# Patient Record
Sex: Male | Born: 1980 | Race: White | Hispanic: No | Smoking: Current every day smoker
Health system: Southern US, Community
[De-identification: ages and names within clinical notes are randomized; demographics above are authoritative.]

## PROBLEM LIST (undated history)

## (undated) DIAGNOSIS — N028 Recurrent and persistent hematuria with other morphologic changes: Secondary | ICD-10-CM

## (undated) DIAGNOSIS — N02B9 Other recurrent and persistent immunoglobulin A nephropathy: Secondary | ICD-10-CM

## (undated) HISTORY — PX: FINGER SURGERY: SHX640

## (undated) HISTORY — PX: KNEE SURGERY: SHX244

---

## 2004-01-06 ENCOUNTER — Emergency Department (HOSPITAL_COMMUNITY): Admission: EM | Admit: 2004-01-06 | Discharge: 2004-01-07 | Payer: Self-pay | Admitting: Emergency Medicine

## 2009-02-04 ENCOUNTER — Ambulatory Visit (HOSPITAL_COMMUNITY): Admission: RE | Admit: 2009-02-04 | Discharge: 2009-02-04 | Payer: Self-pay | Admitting: Internal Medicine

## 2009-02-06 ENCOUNTER — Ambulatory Visit (HOSPITAL_COMMUNITY): Admission: RE | Admit: 2009-02-06 | Discharge: 2009-02-06 | Payer: Self-pay | Admitting: Internal Medicine

## 2009-02-22 ENCOUNTER — Ambulatory Visit (HOSPITAL_BASED_OUTPATIENT_CLINIC_OR_DEPARTMENT_OTHER): Admission: RE | Admit: 2009-02-22 | Discharge: 2009-02-22 | Payer: Self-pay | Admitting: Orthopedic Surgery

## 2009-07-25 ENCOUNTER — Emergency Department (HOSPITAL_COMMUNITY): Admission: EM | Admit: 2009-07-25 | Discharge: 2009-07-25 | Payer: Self-pay | Admitting: Emergency Medicine

## 2009-09-28 ENCOUNTER — Emergency Department (HOSPITAL_COMMUNITY): Admission: EM | Admit: 2009-09-28 | Discharge: 2009-09-28 | Payer: Self-pay | Admitting: Emergency Medicine

## 2009-11-09 ENCOUNTER — Emergency Department (HOSPITAL_COMMUNITY): Admission: EM | Admit: 2009-11-09 | Discharge: 2009-11-09 | Payer: Self-pay | Admitting: Emergency Medicine

## 2009-12-23 ENCOUNTER — Emergency Department (HOSPITAL_COMMUNITY): Admission: EM | Admit: 2009-12-23 | Discharge: 2009-12-23 | Payer: Self-pay | Admitting: Emergency Medicine

## 2010-01-03 ENCOUNTER — Emergency Department (HOSPITAL_COMMUNITY): Admission: EM | Admit: 2010-01-03 | Discharge: 2010-01-03 | Payer: Self-pay | Admitting: Emergency Medicine

## 2010-03-31 ENCOUNTER — Emergency Department (HOSPITAL_COMMUNITY): Admission: EM | Admit: 2010-03-31 | Discharge: 2010-03-31 | Payer: Self-pay | Admitting: Emergency Medicine

## 2010-05-08 ENCOUNTER — Emergency Department (HOSPITAL_COMMUNITY): Admission: EM | Admit: 2010-05-08 | Discharge: 2010-05-08 | Payer: Self-pay | Admitting: Emergency Medicine

## 2010-06-03 ENCOUNTER — Emergency Department (HOSPITAL_COMMUNITY)
Admission: EM | Admit: 2010-06-03 | Discharge: 2010-06-03 | Payer: Self-pay | Source: Home / Self Care | Admitting: Emergency Medicine

## 2010-09-07 ENCOUNTER — Emergency Department (HOSPITAL_COMMUNITY)
Admission: EM | Admit: 2010-09-07 | Discharge: 2010-09-07 | Payer: Self-pay | Source: Home / Self Care | Admitting: Emergency Medicine

## 2011-01-01 IMAGING — CR DG FOOT COMPLETE 3+V*R*
3 series · 3 of 3 positions shown · non-contrast
Comparison: None.

CLINICAL DATA: Laceration

RIGHT FOOT COMPLETE - 3+ VIEW

[view not recorded (1 of 3)]
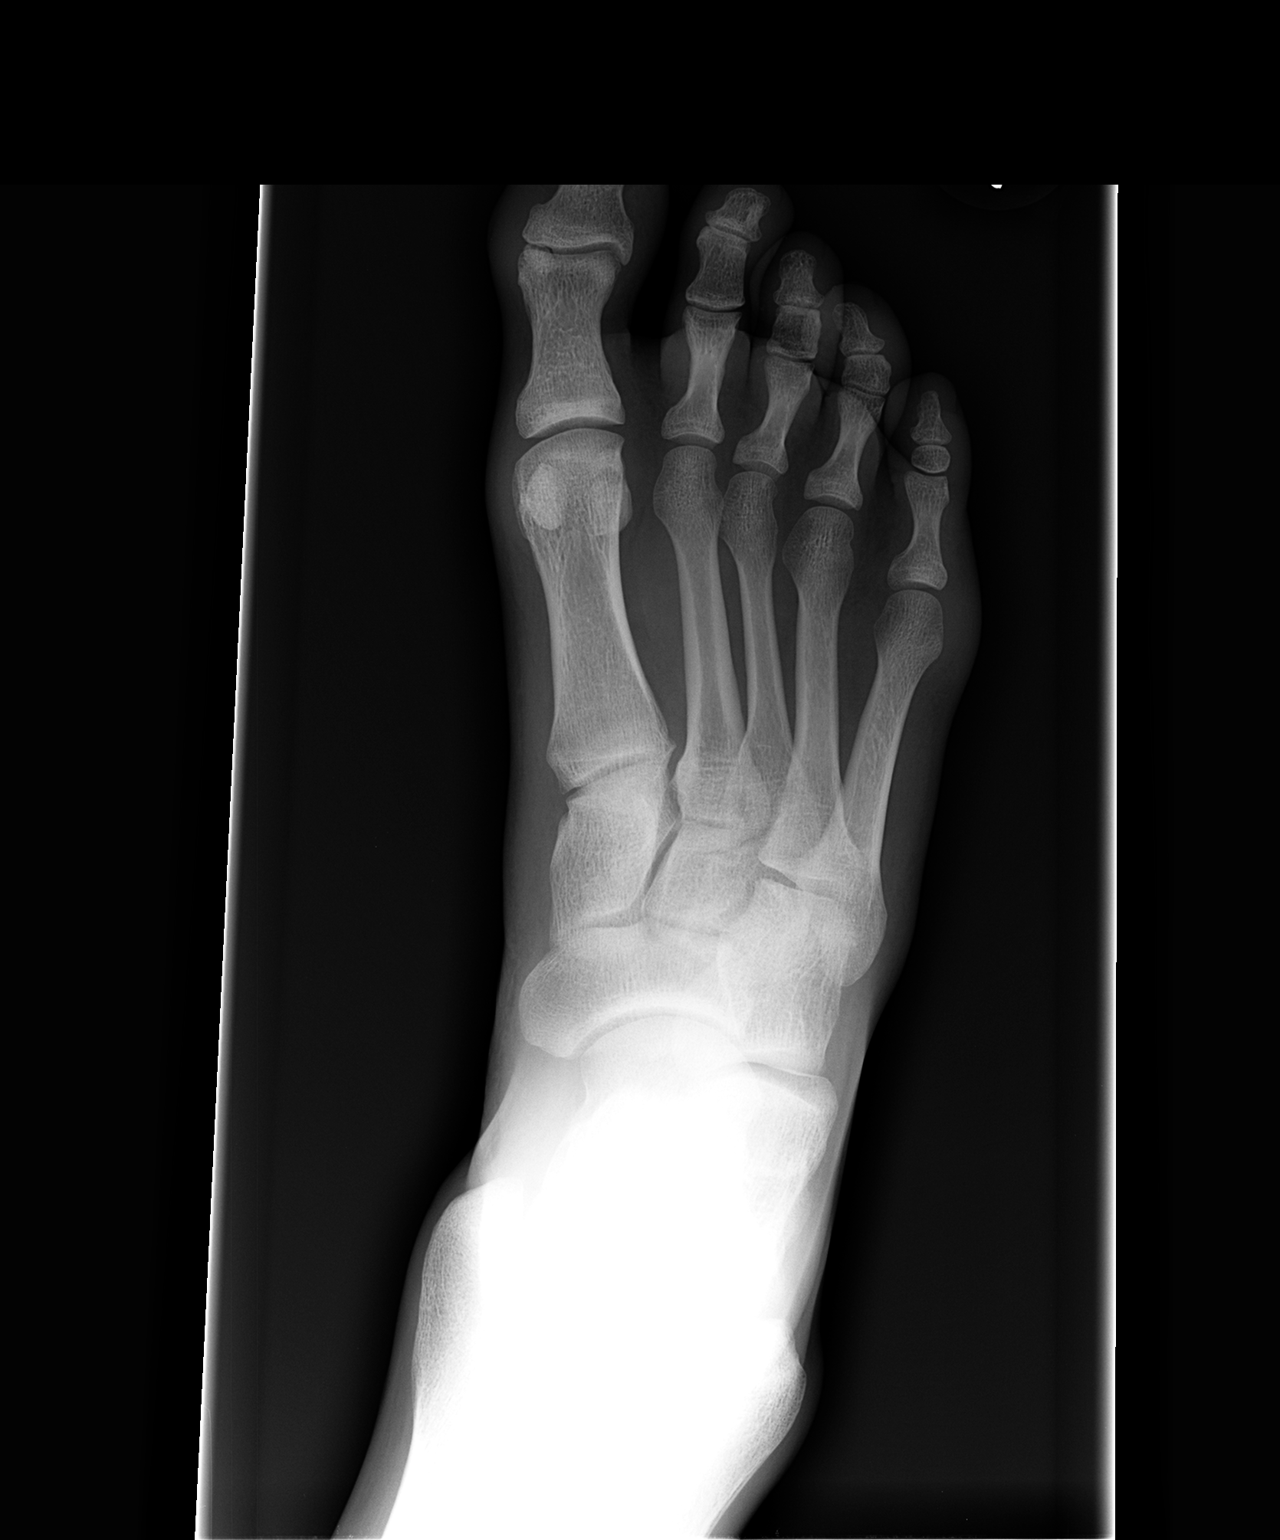

[view not recorded (2 of 3)]
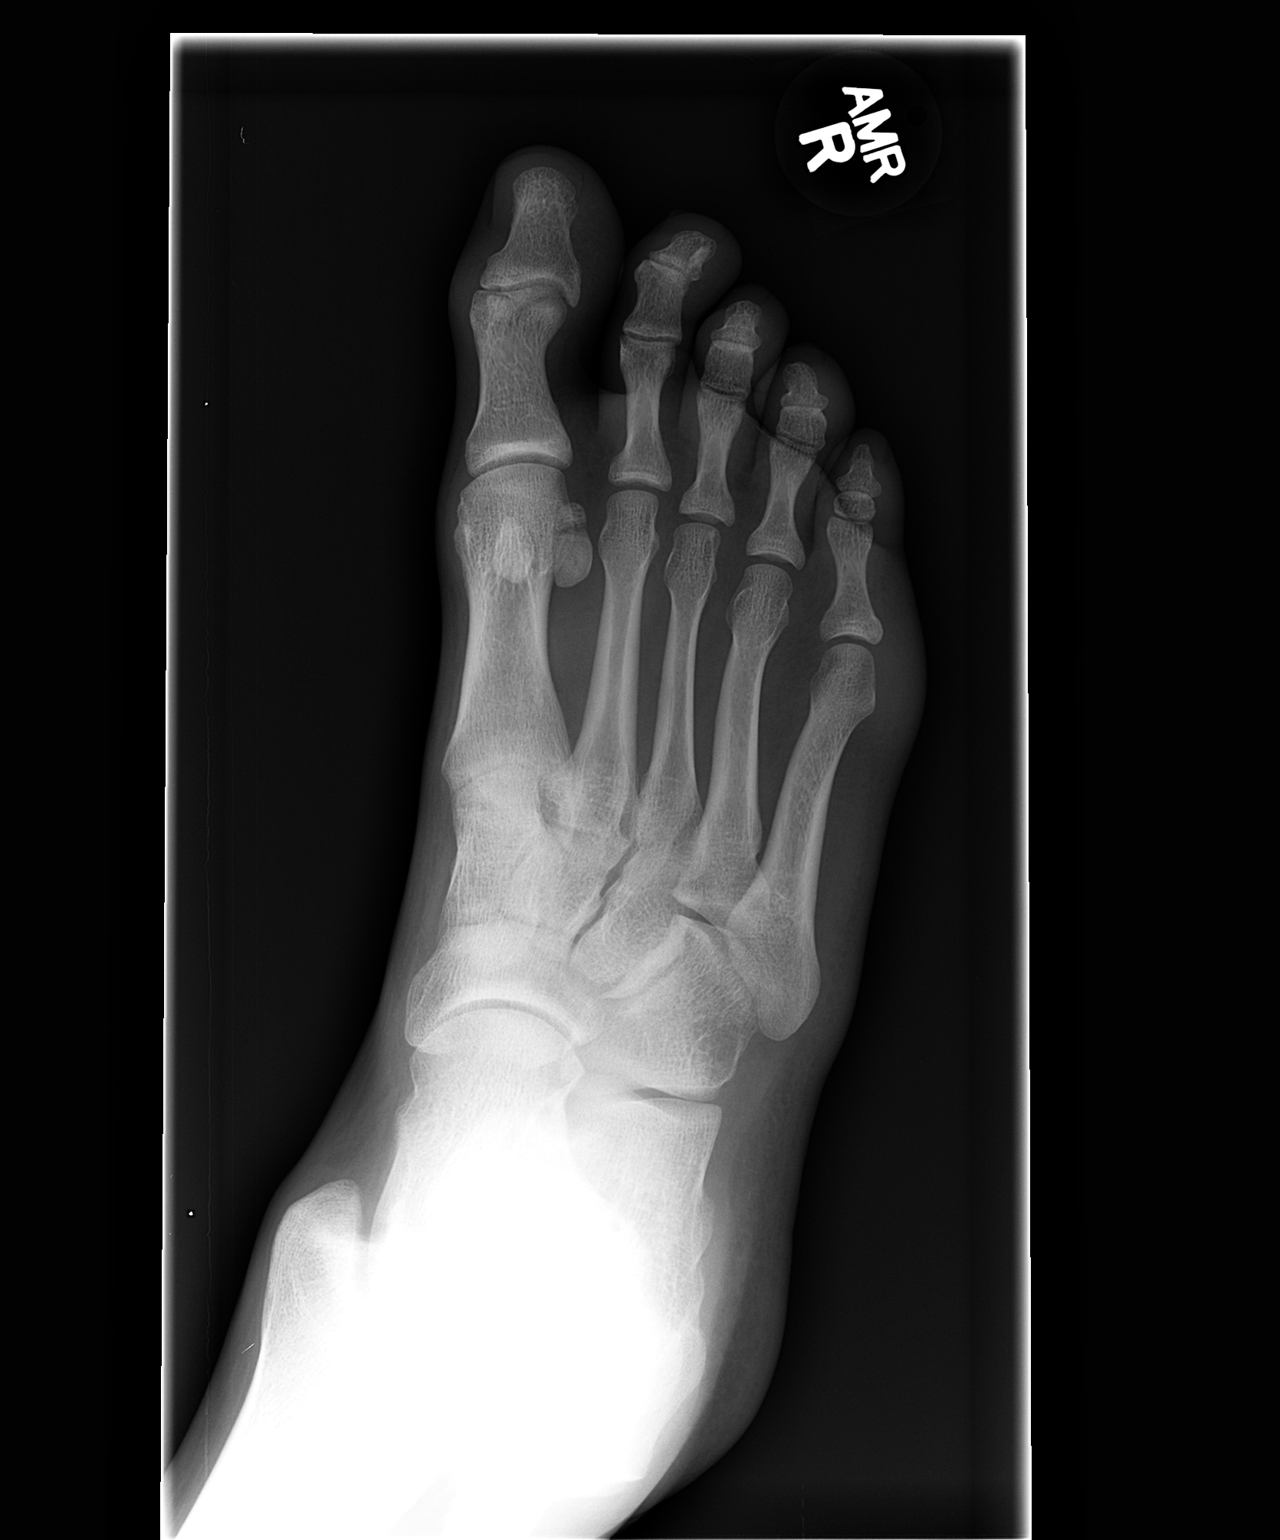

[view not recorded (3 of 3)]
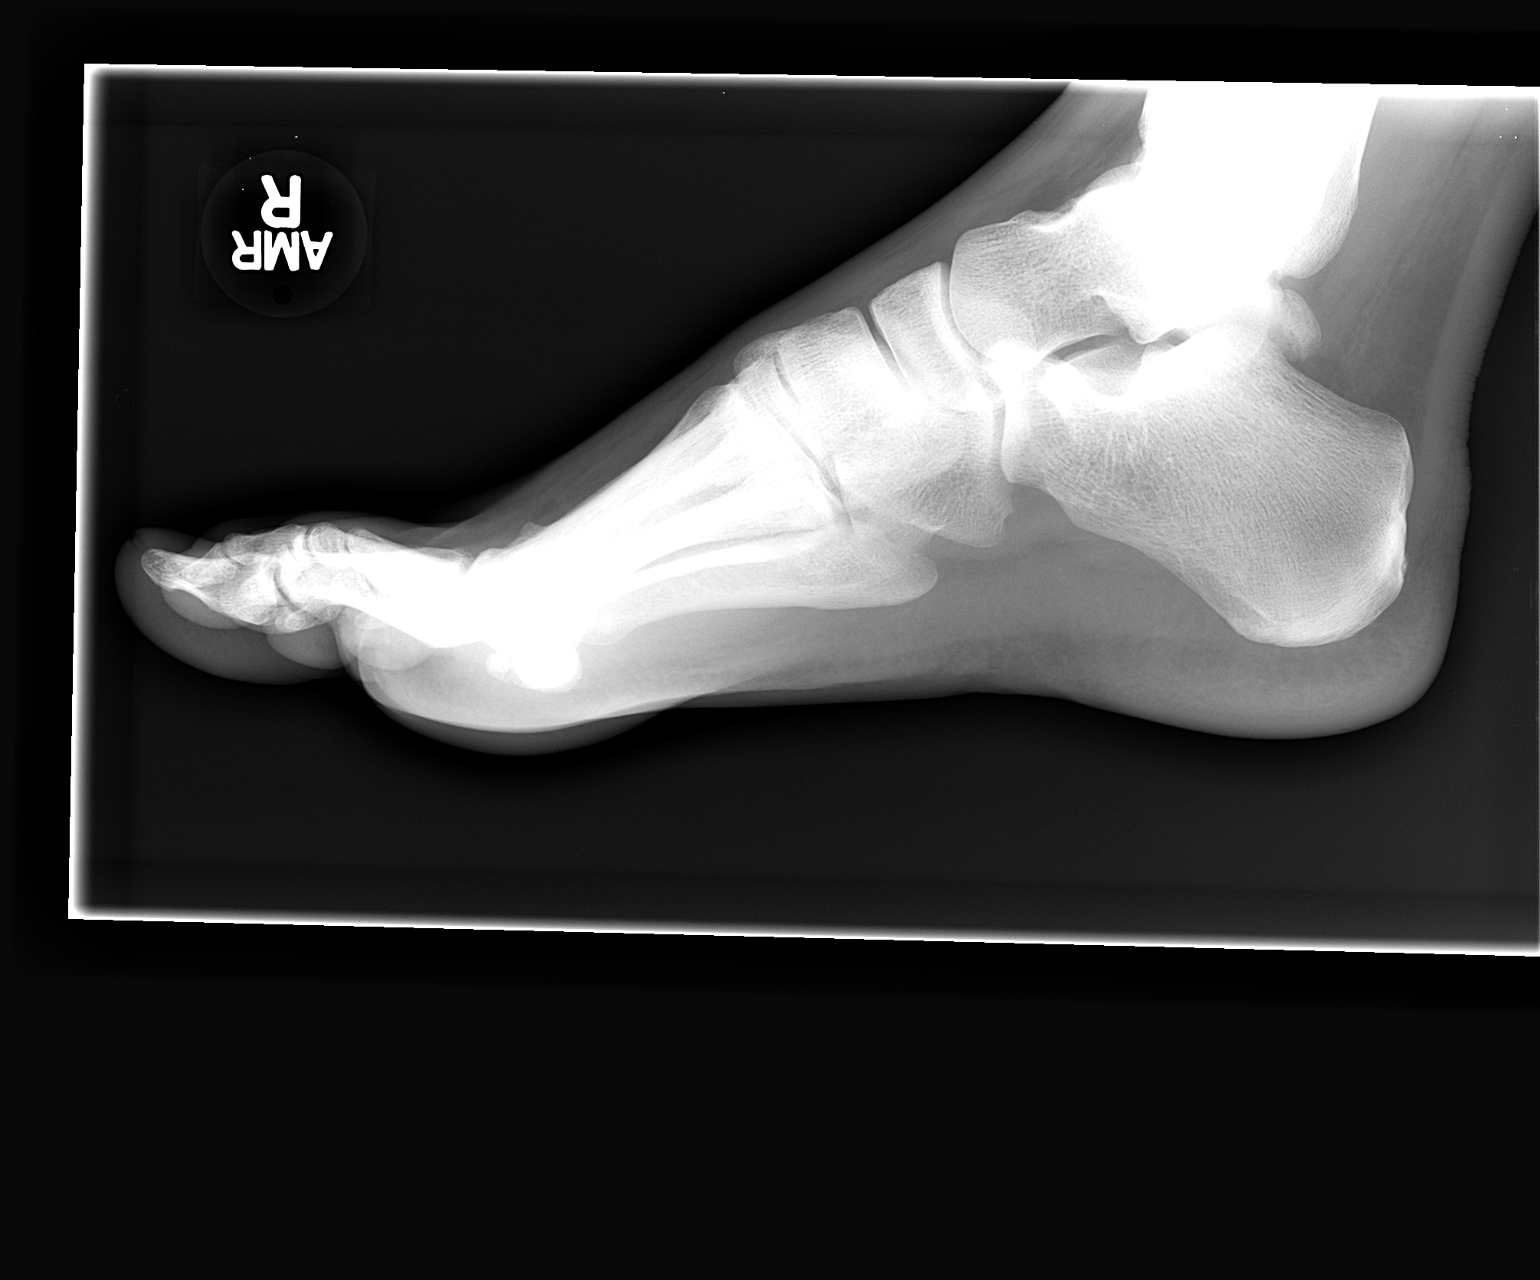

[3 of 3 positions shown; findings below may reference images not displayed]

FINDINGS: No radiodense foreign body or subcutaneous gas. Negative
for fracture, dislocation, or other acute abnormality.  Normal
alignment and mineralization. No significant degenerative change.
Regional soft tissues unremarkable.
IMPRESSION: Negative

## 2011-01-04 ENCOUNTER — Emergency Department (HOSPITAL_COMMUNITY)
Admission: EM | Admit: 2011-01-04 | Discharge: 2011-01-04 | Disposition: A | Discharge status: Home / Self Care | Payer: Medicaid Other | Attending: Emergency Medicine | Admitting: Emergency Medicine

## 2011-01-04 ENCOUNTER — Emergency Department (HOSPITAL_COMMUNITY): Payer: Medicaid Other

## 2011-01-04 DIAGNOSIS — M545 Low back pain, unspecified: Secondary | ICD-10-CM | POA: Insufficient documentation

## 2011-01-04 DIAGNOSIS — M25529 Pain in unspecified elbow: Secondary | ICD-10-CM | POA: Insufficient documentation

## 2011-01-04 DIAGNOSIS — M546 Pain in thoracic spine: Secondary | ICD-10-CM | POA: Insufficient documentation

## 2011-01-04 DIAGNOSIS — S20229A Contusion of unspecified back wall of thorax, initial encounter: Secondary | ICD-10-CM | POA: Insufficient documentation

## 2011-01-04 DIAGNOSIS — S0003XA Contusion of scalp, initial encounter: Secondary | ICD-10-CM | POA: Insufficient documentation

## 2011-01-04 DIAGNOSIS — F319 Bipolar disorder, unspecified: Secondary | ICD-10-CM | POA: Insufficient documentation

## 2011-01-04 DIAGNOSIS — W1789XA Other fall from one level to another, initial encounter: Secondary | ICD-10-CM | POA: Insufficient documentation

## 2011-01-04 DIAGNOSIS — G40909 Epilepsy, unspecified, not intractable, without status epilepticus: Secondary | ICD-10-CM | POA: Insufficient documentation

## 2011-01-04 DIAGNOSIS — M542 Cervicalgia: Secondary | ICD-10-CM | POA: Insufficient documentation

## 2011-01-04 DIAGNOSIS — Y92009 Unspecified place in unspecified non-institutional (private) residence as the place of occurrence of the external cause: Secondary | ICD-10-CM | POA: Insufficient documentation

## 2011-01-04 DIAGNOSIS — S5000XA Contusion of unspecified elbow, initial encounter: Secondary | ICD-10-CM | POA: Insufficient documentation

## 2011-01-04 DIAGNOSIS — Z79899 Other long term (current) drug therapy: Secondary | ICD-10-CM | POA: Insufficient documentation

## 2011-01-06 LAB — POCT HEMOGLOBIN-HEMACUE: Hemoglobin: 14.9 g/dL (ref 13.0–17.0)

## 2011-01-08 ENCOUNTER — Emergency Department (HOSPITAL_COMMUNITY): Payer: Medicaid Other

## 2011-01-08 ENCOUNTER — Emergency Department (HOSPITAL_COMMUNITY)
Admission: EM | Admit: 2011-01-08 | Discharge: 2011-01-08 | Discharge status: Against Medical Advice | Payer: Medicaid Other | Attending: Emergency Medicine | Admitting: Emergency Medicine

## 2011-01-08 DIAGNOSIS — F17200 Nicotine dependence, unspecified, uncomplicated: Secondary | ICD-10-CM | POA: Insufficient documentation

## 2011-01-08 DIAGNOSIS — W1789XA Other fall from one level to another, initial encounter: Secondary | ICD-10-CM | POA: Insufficient documentation

## 2011-01-08 DIAGNOSIS — R079 Chest pain, unspecified: Secondary | ICD-10-CM | POA: Insufficient documentation

## 2011-01-08 DIAGNOSIS — S20219A Contusion of unspecified front wall of thorax, initial encounter: Secondary | ICD-10-CM | POA: Insufficient documentation

## 2011-02-08 ENCOUNTER — Emergency Department (HOSPITAL_COMMUNITY): Payer: Medicaid Other

## 2011-02-08 ENCOUNTER — Emergency Department (HOSPITAL_COMMUNITY)
Admission: EM | Admit: 2011-02-08 | Discharge: 2011-02-08 | Disposition: A | Discharge status: Home / Self Care | Payer: Medicaid Other | Attending: Emergency Medicine | Admitting: Emergency Medicine

## 2011-02-08 DIAGNOSIS — K029 Dental caries, unspecified: Secondary | ICD-10-CM | POA: Insufficient documentation

## 2011-02-08 DIAGNOSIS — F319 Bipolar disorder, unspecified: Secondary | ICD-10-CM | POA: Insufficient documentation

## 2011-02-08 DIAGNOSIS — IMO0002 Reserved for concepts with insufficient information to code with codable children: Secondary | ICD-10-CM | POA: Insufficient documentation

## 2011-02-08 DIAGNOSIS — F411 Generalized anxiety disorder: Secondary | ICD-10-CM | POA: Insufficient documentation

## 2011-02-08 DIAGNOSIS — Z9889 Other specified postprocedural states: Secondary | ICD-10-CM | POA: Insufficient documentation

## 2011-02-08 DIAGNOSIS — G40909 Epilepsy, unspecified, not intractable, without status epilepticus: Secondary | ICD-10-CM | POA: Insufficient documentation

## 2011-02-08 DIAGNOSIS — R51 Headache: Secondary | ICD-10-CM | POA: Insufficient documentation

## 2011-02-08 DIAGNOSIS — S0003XA Contusion of scalp, initial encounter: Secondary | ICD-10-CM | POA: Insufficient documentation

## 2011-02-08 DIAGNOSIS — S1093XA Contusion of unspecified part of neck, initial encounter: Secondary | ICD-10-CM | POA: Insufficient documentation

## 2011-02-08 DIAGNOSIS — Z79899 Other long term (current) drug therapy: Secondary | ICD-10-CM | POA: Insufficient documentation

## 2011-02-08 DIAGNOSIS — R6884 Jaw pain: Secondary | ICD-10-CM | POA: Insufficient documentation

## 2011-02-10 NOTE — Op Note (Signed)
NAMEELIAS, Caleb Campbell             ACCOUNT NO.:  [id number]   MEDICAL RECORD NO.:  [id number]          PATIENT TYPE:  AMB   LOCATION:  DSC                          FACILITY:  [facility]   PHYSICIAN:  Harvie Junior, M.D.   DATE OF BIRTH:  May 28, 1981   DATE OF PROCEDURE:  02/22/2009  DATE OF DISCHARGE:                               OPERATIVE REPORT   PREOPERATIVE DIAGNOSIS:  Lateral meniscal tear.   POSTOPERATIVE DIAGNOSES:  1. Lateral meniscal tear.  2. Medial femoral condylar defect.  3. Osteocartilaginous loose body.   PRINCIPAL PROCEDURE:  1. Partial posterior horn lateral meniscectomy.  2. Chondroplasty of the medial femoral condyle.  3. Removal of osteocartilaginous loose body.   SURGEON:  Harvie Junior, M.D.   ASSISTANT:  Marshia Ly, P.A.   ANESTHESIA:  General.   BRIEF HISTORY:  Ms. Dyar is a 30 year old male with a long history  of having had significant right knee pain.  We treated conservatively  for a period of time.  An MRI showed that he had lateral meniscal tear  and loose bodies.  He was brought to the operating room for operative  intervention.   PROCEDURE:  The patient was brought to the operating room.  After  adequate anesthesia obtained with general anesthetic, the patient was  placed supine on the operating table.  The right leg was then prepped  and draped in usual sterile fashion.  Following this routine  arthroscopic examination of knee revealed there was an obvious  osteocartilaginous loose body, which was removed with a grasper.  Attention was turned to the medial femoral condyle with the significant  chondromalacia of the medial femoral condyle.  Attention was turned up  in the patellofemoral joint, little bit of chondromalacia, not  significantly debrided by a patellar tracking his midline.  Attention  was turned to ACL, which was normal.  Attention was turned to the  lateral side, which has a lateral flap, which is flapping essentially in  a sort of a complex posterior horn tear.  This was debrided with a  suction shaver and straight biting forceps back to a smooth and stable  rim.  Once this was completed, the knee was copiously and  thoroughly irrigated, suctioned dry, and all sterile portals were closed  with a bandage.  A sterile compression dressing was applied.  The  patient was taken to the recovery room and was noted to be in a  satisfactory condition.  Estimated blood loss for the procedure is none.      Harvie Junior, M.D.  Electronically Signed     JLG/MEDQ  D:  02/22/2009  T:  02/23/2009  Job:  [id number]

## 2011-03-13 ENCOUNTER — Emergency Department (HOSPITAL_COMMUNITY)
Admission: EM | Admit: 2011-03-13 | Discharge: 2011-03-13 | Discharge status: Against Medical Advice | Payer: Medicaid Other | Attending: Emergency Medicine | Admitting: Emergency Medicine

## 2011-03-13 DIAGNOSIS — Z0389 Encounter for observation for other suspected diseases and conditions ruled out: Secondary | ICD-10-CM | POA: Insufficient documentation

## 2011-03-14 ENCOUNTER — Emergency Department (HOSPITAL_COMMUNITY): Payer: Medicaid Other

## 2011-03-14 ENCOUNTER — Emergency Department (HOSPITAL_COMMUNITY)
Admission: EM | Admit: 2011-03-14 | Discharge: 2011-03-14 | Disposition: A | Discharge status: Home / Self Care | Payer: Medicaid Other | Attending: Emergency Medicine | Admitting: Emergency Medicine

## 2011-03-14 DIAGNOSIS — F319 Bipolar disorder, unspecified: Secondary | ICD-10-CM | POA: Insufficient documentation

## 2011-03-14 DIAGNOSIS — X500XXA Overexertion from strenuous movement or load, initial encounter: Secondary | ICD-10-CM | POA: Insufficient documentation

## 2011-03-14 DIAGNOSIS — F17200 Nicotine dependence, unspecified, uncomplicated: Secondary | ICD-10-CM | POA: Insufficient documentation

## 2011-03-14 DIAGNOSIS — F411 Generalized anxiety disorder: Secondary | ICD-10-CM | POA: Insufficient documentation

## 2011-03-14 DIAGNOSIS — G40909 Epilepsy, unspecified, not intractable, without status epilepticus: Secondary | ICD-10-CM | POA: Insufficient documentation

## 2011-03-14 DIAGNOSIS — IMO0002 Reserved for concepts with insufficient information to code with codable children: Secondary | ICD-10-CM | POA: Insufficient documentation

## 2011-03-29 ENCOUNTER — Emergency Department (HOSPITAL_COMMUNITY)
Admission: EM | Admit: 2011-03-29 | Discharge: 2011-03-29 | Disposition: A | Discharge status: Home / Self Care | Payer: Medicaid Other | Attending: Emergency Medicine | Admitting: Emergency Medicine

## 2011-03-29 DIAGNOSIS — F1210 Cannabis abuse, uncomplicated: Secondary | ICD-10-CM | POA: Insufficient documentation

## 2011-03-29 DIAGNOSIS — S51809A Unspecified open wound of unspecified forearm, initial encounter: Secondary | ICD-10-CM | POA: Insufficient documentation

## 2011-03-29 DIAGNOSIS — F319 Bipolar disorder, unspecified: Secondary | ICD-10-CM | POA: Insufficient documentation

## 2011-03-29 DIAGNOSIS — Z79899 Other long term (current) drug therapy: Secondary | ICD-10-CM | POA: Insufficient documentation

## 2011-03-29 DIAGNOSIS — G40909 Epilepsy, unspecified, not intractable, without status epilepticus: Secondary | ICD-10-CM | POA: Insufficient documentation

## 2011-03-29 DIAGNOSIS — F411 Generalized anxiety disorder: Secondary | ICD-10-CM | POA: Insufficient documentation

## 2011-03-29 DIAGNOSIS — W268XXA Contact with other sharp object(s), not elsewhere classified, initial encounter: Secondary | ICD-10-CM | POA: Insufficient documentation

## 2011-12-15 IMAGING — CR DG LUMBAR SPINE COMPLETE 4+V
5 series · 5 of 5 positions shown · non-contrast
Comparison: 01/04/2011

CLINICAL DATA: Low back pain, prior fall

LUMBAR SPINE - COMPLETE 4+ VIEW

[view not recorded (1 of 5)]
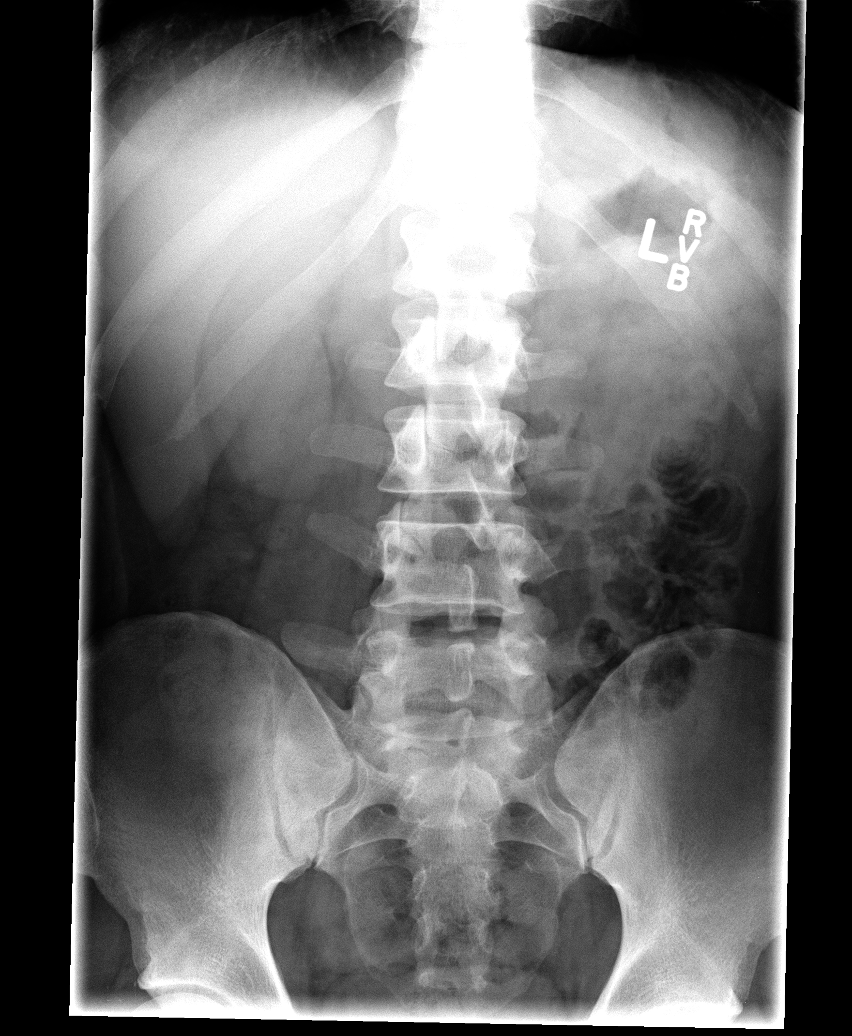

[view not recorded (2 of 5)]
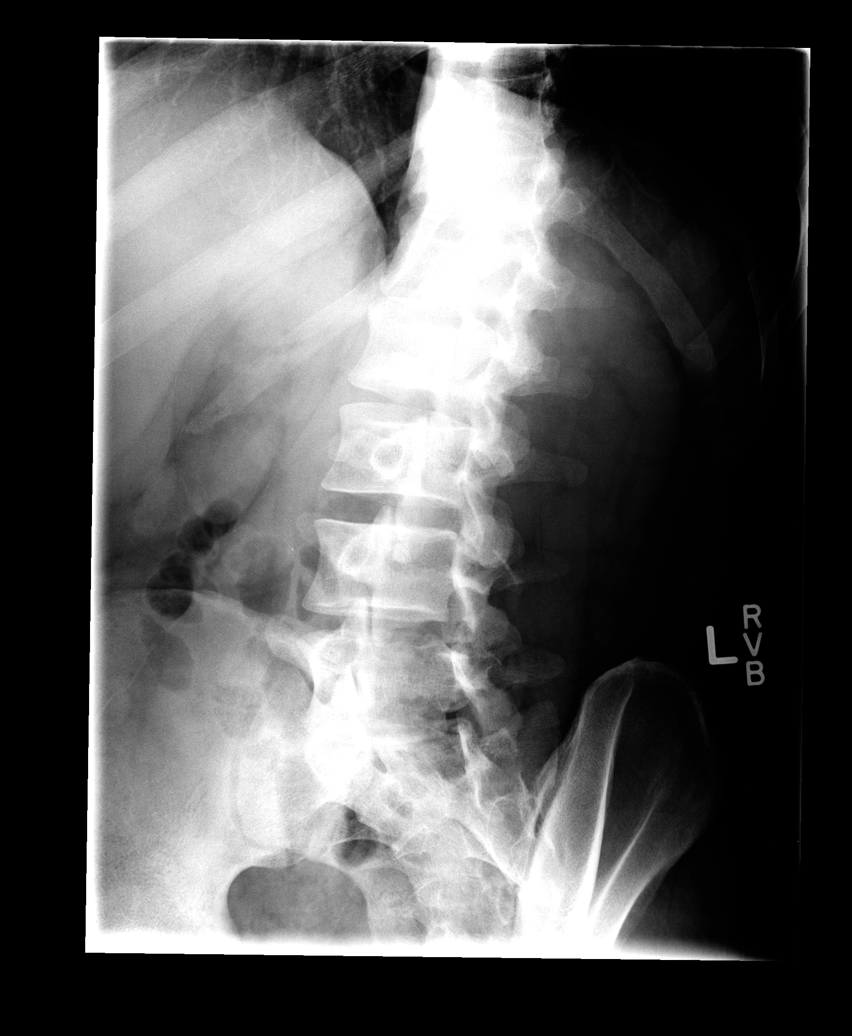

[view not recorded (3 of 5)]
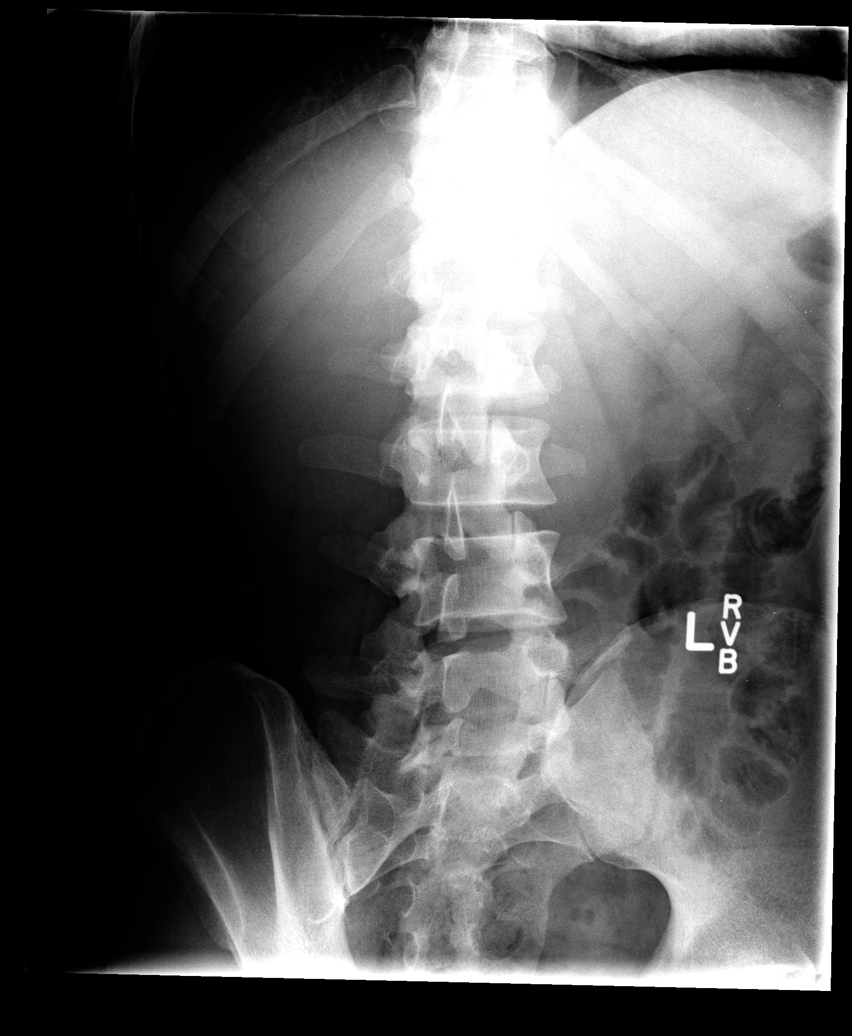

[view not recorded (4 of 5)]
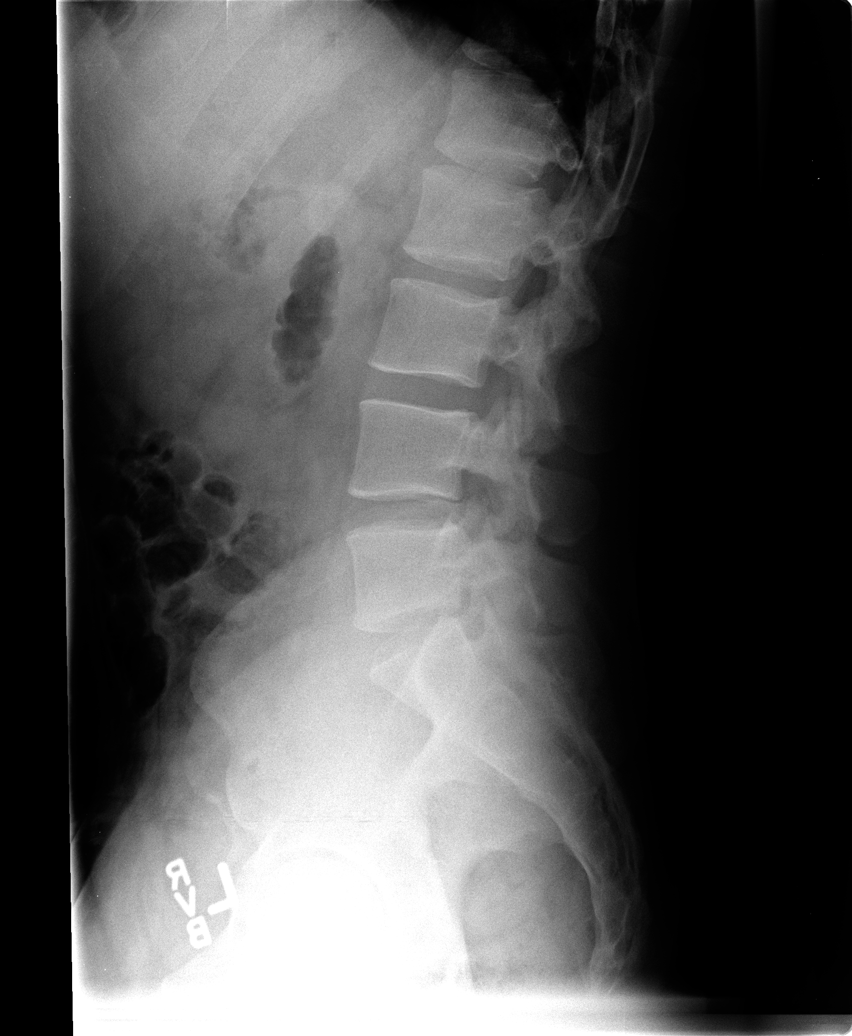

[view not recorded (5 of 5)]
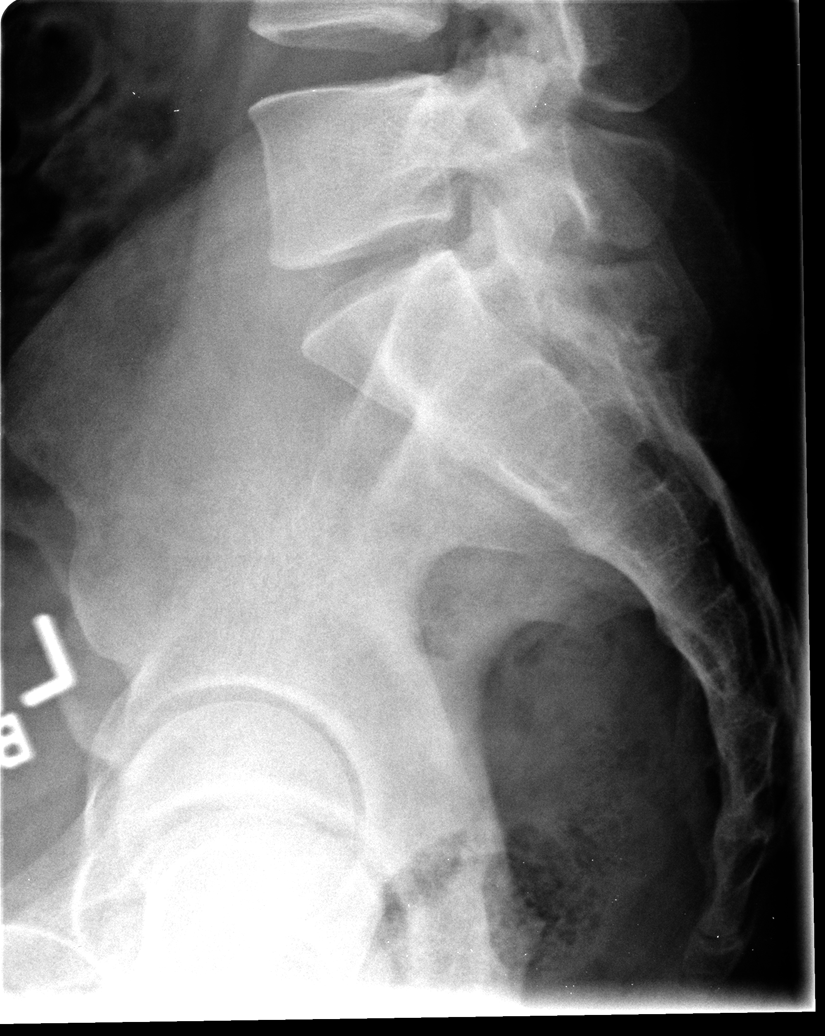

[5 of 5 positions shown; findings below may reference images not displayed]

FINDINGS: There are five lumbar-type vertebral bodies.

Lumbar spine is normal in alignment and position.

No evidence of fracture or dislocation.  The vertebral body heights
and intervertebral disc spaces are maintained.

The visualized bony pelvis appears intact.
IMPRESSION: No fracture or dislocation is seen.

## 2022-03-31 ENCOUNTER — Emergency Department (HOSPITAL_COMMUNITY): Payer: 59

## 2022-03-31 ENCOUNTER — Emergency Department (HOSPITAL_COMMUNITY)
Admission: EM | Admit: 2022-03-31 | Discharge: 2022-03-31 | Disposition: A | Discharge status: Home / Self Care | Payer: 59 | Attending: Emergency Medicine | Admitting: Emergency Medicine

## 2022-03-31 ENCOUNTER — Other Ambulatory Visit: Payer: Self-pay

## 2022-03-31 ENCOUNTER — Encounter (HOSPITAL_COMMUNITY): Payer: Self-pay

## 2022-03-31 DIAGNOSIS — W228XXA Striking against or struck by other objects, initial encounter: Secondary | ICD-10-CM | POA: Diagnosis not present

## 2022-03-31 DIAGNOSIS — S29001A Unspecified injury of muscle and tendon of front wall of thorax, initial encounter: Secondary | ICD-10-CM | POA: Diagnosis not present

## 2022-03-31 DIAGNOSIS — M25561 Pain in right knee: Secondary | ICD-10-CM

## 2022-03-31 DIAGNOSIS — R0781 Pleurodynia: Secondary | ICD-10-CM | POA: Diagnosis not present

## 2022-03-31 DIAGNOSIS — R1012 Left upper quadrant pain: Secondary | ICD-10-CM | POA: Diagnosis not present

## 2022-03-31 DIAGNOSIS — S20213A Contusion of bilateral front wall of thorax, initial encounter: Secondary | ICD-10-CM | POA: Diagnosis not present

## 2022-03-31 DIAGNOSIS — S20212A Contusion of left front wall of thorax, initial encounter: Secondary | ICD-10-CM

## 2022-03-31 DIAGNOSIS — S8001XA Contusion of right knee, initial encounter: Secondary | ICD-10-CM | POA: Diagnosis not present

## 2022-03-31 HISTORY — DX: Recurrent and persistent hematuria with other morphologic changes: N02.8

## 2022-03-31 HISTORY — DX: Other recurrent and persistent immunoglobulin A nephropathy: N02.B9

## 2022-03-31 LAB — CBC WITH DIFFERENTIAL/PLATELET
Abs Immature Granulocytes: 0.03 10*3/uL (ref 0.00–0.07)
Basophils Absolute: 0.1 10*3/uL (ref 0.0–0.1)
Basophils Relative: 1 %
Eosinophils Absolute: 0.1 10*3/uL (ref 0.0–0.5)
Eosinophils Relative: 1 %
HCT: 45.8 % (ref 39.0–52.0)
Hemoglobin: 15.7 g/dL (ref 13.0–17.0)
Immature Granulocytes: 0 %
Lymphocytes Relative: 27 %
Lymphs Abs: 2.5 10*3/uL (ref 0.7–4.0)
MCH: 32 pg (ref 26.0–34.0)
MCHC: 34.3 g/dL (ref 30.0–36.0)
MCV: 93.5 fL (ref 80.0–100.0)
Monocytes Absolute: 0.8 10*3/uL (ref 0.1–1.0)
Monocytes Relative: 9 %
Neutro Abs: 5.9 10*3/uL (ref 1.7–7.7)
Neutrophils Relative %: 62 %
Platelets: 311 10*3/uL (ref 150–400)
RBC: 4.9 MIL/uL (ref 4.22–5.81)
RDW: 12.8 % (ref 11.5–15.5)
WBC: 9.3 10*3/uL (ref 4.0–10.5)
nRBC: 0 % (ref 0.0–0.2)

## 2022-03-31 LAB — COMPREHENSIVE METABOLIC PANEL
ALT: 19 U/L (ref 0–44)
AST: 21 U/L (ref 15–41)
Albumin: 4.2 g/dL (ref 3.5–5.0)
Alkaline Phosphatase: 73 U/L (ref 38–126)
Anion gap: 7 (ref 5–15)
BUN: 15 mg/dL (ref 6–20)
CO2: 25 mmol/L (ref 22–32)
Calcium: 9 mg/dL (ref 8.9–10.3)
Chloride: 108 mmol/L (ref 98–111)
Creatinine, Ser: 0.96 mg/dL (ref 0.61–1.24)
GFR, Estimated: >60 mL/min (ref >60)
Glucose, Bld: 102 mg/dL — ABNORMAL HIGH (ref 70–99)
Potassium: 3.7 mmol/L (ref 3.5–5.1)
Sodium: 140 mmol/L (ref 135–145)
Total Bilirubin: 0.7 mg/dL (ref 0.3–1.2)
Total Protein: 7.6 g/dL (ref 6.5–8.1)

## 2022-03-31 MED ORDER — HYDROCODONE-ACETAMINOPHEN 5-325 MG PO TABS: 2.0000 | ORAL_TABLET | Freq: Four times a day (QID) | ORAL | 0 refills | Status: DC | PRN

## 2022-03-31 MED ORDER — HYDROCODONE-ACETAMINOPHEN 5-325 MG PO TABS
2.0000 | ORAL_TABLET | Freq: Once | ORAL | Status: AC
  Administered 2022-03-31: 2 via ORAL
  Filled 2022-03-31: qty 2

## 2022-03-31 MED ORDER — IOHEXOL 300 MG/ML  SOLN
100.0000 mL | Freq: Once | INTRAMUSCULAR | Status: AC | PRN
  Administered 2022-03-31: 100 mL via INTRAVENOUS
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
## 2022-03-31 NOTE — ED Notes (Signed)
Patient transported to CT 

## 2022-03-31 NOTE — Discharge Instructions (Signed)
Please pick up your medications at the pharmacy listed on this paperwork.  I have also given you number to call orthopedics.  Please return to the emergency department for any worsening symptoms you might have.

## 2022-07-01 DIAGNOSIS — R630 Anorexia: Secondary | ICD-10-CM | POA: Diagnosis not present

## 2022-07-01 DIAGNOSIS — R5383 Other fatigue: Secondary | ICD-10-CM | POA: Diagnosis not present

## 2022-08-12 DIAGNOSIS — Z79899 Other long term (current) drug therapy: Secondary | ICD-10-CM | POA: Diagnosis not present

## 2022-08-12 DIAGNOSIS — F1124 Opioid dependence with opioid-induced mood disorder: Secondary | ICD-10-CM | POA: Diagnosis not present

## 2022-08-16 ENCOUNTER — Emergency Department (HOSPITAL_COMMUNITY)
Admission: EM | Admit: 2022-08-16 | Discharge: 2022-08-16 | Discharge status: Against Medical Advice | Payer: 59 | Source: Home / Self Care

## 2022-08-17 DIAGNOSIS — Z79899 Other long term (current) drug therapy: Secondary | ICD-10-CM | POA: Diagnosis not present

## 2022-08-17 DIAGNOSIS — F1124 Opioid dependence with opioid-induced mood disorder: Secondary | ICD-10-CM | POA: Diagnosis not present

## 2022-08-24 DIAGNOSIS — Z79899 Other long term (current) drug therapy: Secondary | ICD-10-CM | POA: Diagnosis not present

## 2022-08-24 DIAGNOSIS — F1124 Opioid dependence with opioid-induced mood disorder: Secondary | ICD-10-CM | POA: Diagnosis not present

## 2022-08-26 ENCOUNTER — Ambulatory Visit (INDEPENDENT_AMBULATORY_CARE_PROVIDER_SITE_OTHER): Payer: 59 | Admitting: Nurse Practitioner

## 2022-08-26 ENCOUNTER — Encounter: Payer: Self-pay | Admitting: Nurse Practitioner

## 2022-08-26 VITALS — BP 133/82 | HR 87 | Temp 98.6°F | Ht 72.0 in | Wt 172.0 lb

## 2022-08-26 DIAGNOSIS — Z0000 Encounter for general adult medical examination without abnormal findings: Secondary | ICD-10-CM | POA: Diagnosis not present

## 2022-08-26 DIAGNOSIS — R0789 Other chest pain: Secondary | ICD-10-CM

## 2022-08-26 DIAGNOSIS — Z0001 Encounter for general adult medical examination with abnormal findings: Secondary | ICD-10-CM

## 2022-08-26 DIAGNOSIS — Z7689 Persons encountering health services in other specified circumstances: Secondary | ICD-10-CM | POA: Insufficient documentation

## 2022-08-26 MED ORDER — METHOCARBAMOL 500 MG PO TABS: 500.0000 mg | ORAL_TABLET | Freq: Four times a day (QID) | ORAL | 1 refills | Status: DC

## 2022-08-26 MED ORDER — PREDNISONE 20 MG PO TABS: 20.0000 mg | ORAL_TABLET | Freq: Every day | ORAL | 0 refills | Status: DC

## 2022-08-26 NOTE — Patient Instructions (Signed)
Chest Wall Pain Chest wall pain is pain in or around the bones and muscles of your chest. Chest wall pain may be caused by: An injury. Coughing a lot. Using your chest and arm muscles too much. Sometimes, the cause may not be known. This pain may take a few weeks or longer to get better. Follow these instructions at home: Managing pain, stiffness, and swelling If told, put ice on the painful area: Put ice in a plastic bag. Place a towel between your skin and the bag. Leave the ice on for 20 minutes, 2-3 times a day.  Activity Rest as told by your doctor. Avoid doing things that cause pain. This includes lifting heavy items. Ask your doctor what activities are safe for you. General instructions  Take over-the-counter and prescription medicines only as told by your doctor. Do not use any products that contain nicotine or tobacco, such as cigarettes, e-cigarettes, and chewing tobacco. If you need help quitting, ask your doctor. Keep all follow-up visits as told by your doctor. This is important. Contact a doctor if: You have a fever. Your chest pain gets worse. You have new symptoms. Get help right away if: You feel sick to your stomach (nauseous) or you throw up (vomit). You feel sweaty or light-headed. You have a cough with mucus from your lungs (sputum) or you cough up blood. You are short of breath. These symptoms may be an emergency. Do not wait to see if the symptoms will go away. Get medical help right away. Call your local emergency services (911 in the [place] Do not drive yourself to the hospital. Summary Chest wall pain is pain in or around the bones and muscles of your chest. It may be treated with ice, rest, and medicines. Your condition may also get better if you avoid doing things that cause pain. Contact a doctor if you have a fever, chest pain that gets worse, or new symptoms. Get help right away if you feel light-headed or you get short of breath. These symptoms may  be an emergency. This information is not intended to replace advice given to you by your health care provider. Make sure you discuss any questions you have with your health care provider. Document Revised: 12/17/2020 Document Reviewed: 11/29/2020 Elsevier Patient Education  2023 [employer] Health Maintenance, Male Adopting a healthy lifestyle and getting preventive care are important in promoting health and wellness. Ask your health care provider about: The right schedule for you to have regular tests and exams. Things you can do on your own to prevent diseases and keep yourself healthy. What should I know about diet, weight, and exercise? Eat a healthy diet  Eat a diet that includes plenty of vegetables, fruits, low-fat dairy products, and lean protein. Do not eat a lot of foods that are high in solid fats, added sugars, or sodium. Maintain a healthy weight Body mass index (BMI) is a measurement that can be used to identify possible weight problems. It estimates body fat based on height and weight. Your health care provider can help determine your BMI and help you achieve or maintain a healthy weight. Get regular exercise Get regular exercise. This is one of the most important things you can do for your health. Most adults should: Exercise for at least 150 minutes each week. The exercise should increase your heart rate and make you sweat (moderate-intensity exercise). Do strengthening exercises at least twice a week. This is in addition to the moderate-intensity exercise. Spend less time  sitting. Even light physical activity can be beneficial. Watch cholesterol and blood lipids Have your blood tested for lipids and cholesterol at 43 years of age, then have this test every 5 years. You may need to have your cholesterol levels checked more often if: Your lipid or cholesterol levels are high. You are older than 43 years of age. You are at high risk for heart disease. What should I know  about cancer screening? Many types of cancers can be detected early and may often be prevented. Depending on your health history and family history, you may need to have cancer screening at various ages. This may include screening for: Colorectal cancer. Prostate cancer. Skin cancer. Lung cancer. What should I know about heart disease, diabetes, and high blood pressure? Blood pressure and heart disease High blood pressure causes heart disease and increases the risk of stroke. This is more likely to develop in people who have high blood pressure readings or are overweight. Talk with your health care provider about your target blood pressure readings. Have your blood pressure checked: Every 3-5 years if you are 62-27 years of age. Every year if you are 58 years old or older. If you are between the ages of 50 and 36 and are a current or former smoker, ask your health care provider if you should have a one-time screening for abdominal aortic aneurysm (AAA). Diabetes Have regular diabetes screenings. This checks your fasting blood sugar level. Have the screening done: Once every three years after age 60 if you are at a normal weight and have a low risk for diabetes. More often and at a younger age if you are overweight or have a high risk for diabetes. What should I know about preventing infection? Hepatitis B If you have a higher risk for hepatitis B, you should be screened for this virus. Talk with your health care provider to find out if you are at risk for hepatitis B infection. Hepatitis C Blood testing is recommended for: Everyone born from [birth year] through 1967. Anyone with known risk factors for hepatitis C. Sexually transmitted infections (STIs) You should be screened each year for STIs, including gonorrhea and chlamydia, if: You are sexually active and are younger than 43 years of age. You are older than 43 years of age and your health care provider tells you that you are at risk for  this type of infection. Your sexual activity has changed since you were last screened, and you are at increased risk for chlamydia or gonorrhea. Ask your health care provider if you are at risk. Ask your health care provider about whether you are at high risk for HIV. Your health care provider may recommend a prescription medicine to help prevent HIV infection. If you choose to take medicine to prevent HIV, you should first get tested for HIV. You should then be tested every 3 months for as long as you are taking the medicine. Follow these instructions at home: Alcohol use Do not drink alcohol if your health care provider tells you not to drink. If you drink alcohol: Limit how much you have to 0-2 drinks a day. Know how much alcohol is in your drink. In the [place] one drink equals one 12 oz bottle of beer (355 mL), one 5 oz glass of wine (148 mL), or one 1 oz glass of hard liquor (44 mL). Lifestyle Do not use any products that contain nicotine or tobacco. These products include cigarettes, chewing tobacco, and vaping devices, such as e-cigarettes.  If you need help quitting, ask your health care provider. Do not use street drugs. Do not share needles. Ask your health care provider for help if you need support or information about quitting drugs. General instructions Schedule regular health, dental, and eye exams. Stay current with your vaccines. Tell your health care provider if: You often feel depressed. You have ever been abused or do not feel safe at home. Summary Adopting a healthy lifestyle and getting preventive care are important in promoting health and wellness. Follow your health care provider's instructions about healthy diet, exercising, and getting tested or screened for diseases. Follow your health care provider's instructions on monitoring your cholesterol and blood pressure. This information is not intended to replace advice given to you by your health care provider. Make sure  you discuss any questions you have with your health care provider. Document Revised: 02/03/2021 Document Reviewed: 02/03/2021 Elsevier Patient Education  [place].

## 2022-08-26 NOTE — Progress Notes (Signed)
New Patient Note  RE: Caleb Campbell MRN: [id number] DOB: Jan 15, 1981 Date of Office Visit: 08/26/2022  Chief Complaint: Establish Care and Chest Pain  History of Present Illness: .   Encounter for general adult medical examination Physical: Patient's last physical exam was 10 year ago .  Weight: Appropriate for height (BMI less than 27%) ;  Blood Pressure: Normal (BP less than 120/80) ;  Medical History: Patient history reviewed ; Family history reviewed ;  Allergies Reviewed: No change in current allergies ;  Medications Reviewed: Medications reviewed - no changes ;  Lipids: Normal lipid levels ;  Smoking: Life-long-smoker ;  Physical Activity: Exercises at least 3 times per week Alcohol/Drug Use: Is a non-drinker ; No illicit drug use ;  Patient is not afflicted from Stress Incontinence and Urge Incontinence  Safety: reviewed ; Patient wears a seat belt, has smoke detectors, has carbon monoxide detectors, practices appropriate gun safety, and wears sunscreen with extended sun exposure. Dental Care: biannual cleanings, brushes and flosses daily. Ophthalmology/Optometry: Annual visit.  Hearing loss: impaired hearing left ear Vision impairments: prescription glasses   Pain  He reports recurrent right rib pain. was not an injury that may have caused the pain, patient reports that he participates in regorous exercises. The pain started about a week ago and is worsening. The pain does not radiate . The pain is described as aching and soreness, is 8/10 in intensity, occurring intermittently. Symptoms are worse in the: the same all day  Aggravating factors: bending sideways and walking Relieving factors: none.  He has tried acetaminophen with no relief.    Assessment and Plan: Caleb Campbell is a 41 y.o. male with: Chest wall pain Patient chest wall pay is not controlled, started patient on Robaxin and prednisone, continue tylenol as needed, ice pack, brace and rest from rigorous  exeresis.     Establishing care with new doctor, encounter for Patient is establishing care today, completed labs, education and annual physical exam.   Encounter for annual physical exam Head to toe assessment completed, education provided on health maintenance and preventative care. Patient will [facility] medical records from prison to the clinic as he reports having kidney disease. Currently we have no records and patient can not remember his medication.  Return in about 1 year (around 08/27/2023) for Annual physical .   Diagnostics:   Past Medical History: Patient Active Problem List   Diagnosis Date Noted   Chest wall pain 08/26/2022   Establishing care with new doctor, encounter for 08/26/2022   Encounter for annual physical exam 08/26/2022   Past Medical History:  Diagnosis Date   IgA nephropathy    Past Surgical History: Past Surgical History:  Procedure Laterality Date   KNEE SURGERY     Medication List:  Current Outpatient Medications  Medication Sig Dispense Refill   albuterol (VENTOLIN HFA) 108 (90 Base) MCG/ACT inhaler Inhale into the lungs.     amLODipine (NORVASC) 5 MG tablet Take 1 tablet by mouth daily.     brompheniramine-pseudoephedrine-DM 30-2-10 MG/5ML syrup Take by mouth.     doxycycline (VIBRAMYCIN) 100 MG capsule Take 100 mg by mouth 2 (two) times daily.     gabapentin (NEURONTIN) 600 MG tablet Take 600 mg by mouth 3 (three) times daily.     methocarbamol (ROBAXIN) 500 MG tablet Take 1 tablet (500 mg total) by mouth 4 (four) times daily. 30 tablet 1   predniSONE (DELTASONE) 20 MG tablet Take 1 tablet (20 mg total) by mouth daily  with breakfast. 6 tablet 0   No current facility-administered medications for this visit.   Allergies: No Known Allergies Social History: Social History   Socioeconomic History   Marital status: Divorced    Spouse name: Not on file   Number of children: 2   Years of education: Not on file   Highest education level: GED  or equivalent  Occupational History   Not on file  Tobacco Use   Smoking status: Every Day    Types: Cigarettes   Smokeless tobacco: Current    Types: Chew  Vaping Use   Vaping Use: Never used  Substance and Sexual Activity   Alcohol use: Not Currently   Drug use: Not Currently   Sexual activity: Yes  Other Topics Concern   Not on file  Social History Narrative   Not on file   Social Determinants of Health   Financial Resource Strain: Not on file  Food Insecurity: Not on file  Transportation Needs: Not on file  Physical Activity: Not on file  Stress: Not on file  Social Connections: Not on file       Family History: History reviewed. No pertinent family history.       Review of Systems  Constitutional: Negative.   HENT: Negative.    Eyes: Negative.   Respiratory: Negative.    Cardiovascular:  Positive for chest pain.  Gastrointestinal: Negative.   Genitourinary: Negative.   Musculoskeletal: Negative.   Skin: Negative.   Neurological: Negative.   All other systems reviewed and are negative.  Objective: BP 133/82   Pulse 87   Temp 98.6 F (37 C)   Ht 6' (1.829 m)   Wt 172 lb (78 kg)   SpO2 90%   BMI 23.33 kg/m  Body mass index is 23.33 kg/m.  Physical Exam Vitals and nursing note reviewed.  Constitutional:      Appearance: Normal appearance.  HENT:     Head: Normocephalic.     Right Ear: External ear normal.     Left Ear: External ear normal.     Nose: Nose normal. No congestion.     Mouth/Throat:     Mouth: Mucous membranes are moist.     Pharynx: Oropharynx is clear. No oropharyngeal exudate.  Eyes:     Conjunctiva/sclera: Conjunctivae normal.  Cardiovascular:     Rate and Rhythm: Normal rate and regular rhythm.     Pulses: Normal pulses.     Heart sounds: Normal heart sounds.  Pulmonary:     Effort: Pulmonary effort is normal.     Breath sounds: Normal breath sounds.  Abdominal:     General: Bowel sounds are normal.   Musculoskeletal:       Arms:       Back:     Comments: Chest wall pain and back pain without sciatica   Skin:    General: Skin is warm.     Findings: No erythema or rash.  Neurological:     General: No focal deficit present.     Mental Status: He is alert and oriented to person, place, and time.  Psychiatric:        Mood and Affect: Mood normal.        Behavior: Behavior normal.    The plan was reviewed with the patient/family, and all questions/concerned were addressed.  It was my pleasure to see Caleb Campbell today and participate in his care. Please feel free to contact me with any questions or concerns.  Sincerely,  Danial Hlavac  Purvis Sheffield NP [facility]

## 2022-08-26 NOTE — Assessment & Plan Note (Signed)
Head to toe assessment completed, education provided on health maintenance and preventative care. Patient will send medical records from prison to the clinic as he reports having kidney disease. Currently we have no records and patient can not remember his medication.

## 2022-08-26 NOTE — Assessment & Plan Note (Signed)
Patient is establishing care today, completed labs, education and annual physical exam.

## 2022-08-26 NOTE — Assessment & Plan Note (Signed)
Patient chest wall pay is not controlled, started patient on Robaxin and prednisone, continue tylenol as needed, ice pack, brace and rest from rigorous exeresis.

## 2022-08-27 LAB — CBC WITH DIFFERENTIAL/PLATELET
Basophils Absolute: 0.1 10*3/uL (ref 0.0–0.2)
Basos: 1 %
EOS (ABSOLUTE): 0.5 10*3/uL — ABNORMAL HIGH (ref 0.0–0.4)
Eos: 6 %
Hematocrit: 45 % (ref 37.5–51.0)
Hemoglobin: 14.8 g/dL (ref 13.0–17.7)
Immature Grans (Abs): 0 10*3/uL (ref 0.0–0.1)
Immature Granulocytes: 0 %
Lymphocytes Absolute: 1.9 10*3/uL (ref 0.7–3.1)
Lymphs: 23 %
MCH: 31.4 pg (ref 26.6–33.0)
MCHC: 32.9 g/dL (ref 31.5–35.7)
MCV: 96 fL (ref 79–97)
Monocytes Absolute: 0.7 10*3/uL (ref 0.1–0.9)
Monocytes: 8 %
Neutrophils Absolute: 5 10*3/uL (ref 1.4–7.0)
Neutrophils: 62 %
Platelets: 290 10*3/uL (ref 150–450)
RBC: 4.71 x10E6/uL (ref 4.14–5.80)
RDW: 11.8 % (ref 11.6–15.4)
WBC: 8.1 10*3/uL (ref 3.4–10.8)

## 2022-08-27 LAB — CMP14+EGFR
ALT: 17 IU/L (ref 0–44)
AST: 21 IU/L (ref 0–40)
Albumin/Globulin Ratio: 1.8 (ref 1.2–2.2)
Albumin: 4.1 g/dL (ref 4.1–5.1)
Alkaline Phosphatase: 88 IU/L (ref 44–121)
BUN/Creatinine Ratio: 15 (ref 9–20)
BUN: 15 mg/dL (ref 6–24)
Bilirubin Total: 0.2 mg/dL (ref 0.0–1.2)
CO2: 25 mmol/L (ref 20–29)
Calcium: 9.1 mg/dL (ref 8.7–10.2)
Chloride: 103 mmol/L (ref 96–106)
Creatinine, Ser: 0.98 mg/dL (ref 0.76–1.27)
Globulin, Total: 2.3 g/dL (ref 1.5–4.5)
Glucose: 69 mg/dL — ABNORMAL LOW (ref 70–99)
Potassium: 4.3 mmol/L (ref 3.5–5.2)
Sodium: 142 mmol/L (ref 134–144)
Total Protein: 6.4 g/dL (ref 6.0–8.5)
eGFR: 99 mL/min/{1.73_m2} (ref >59)

## 2022-08-27 LAB — LIPID PANEL
Chol/HDL Ratio: 3.6 ratio (ref 0.0–5.0)
Cholesterol, Total: 184 mg/dL (ref 100–199)
HDL: 51 mg/dL (ref >39)
LDL Chol Calc (NIH): 98 mg/dL (ref 0–99)
Triglycerides: 206 mg/dL — ABNORMAL HIGH (ref 0–149)
VLDL Cholesterol Cal: 35 mg/dL (ref 5–40)

## 2022-08-30 ENCOUNTER — Encounter (HOSPITAL_COMMUNITY): Payer: Self-pay

## 2022-08-30 ENCOUNTER — Emergency Department (HOSPITAL_COMMUNITY)
Admission: EM | Admit: 2022-08-30 | Discharge: 2022-08-30 | Discharge status: Against Medical Advice | Payer: 59 | Attending: Emergency Medicine | Admitting: Emergency Medicine

## 2022-08-30 ENCOUNTER — Other Ambulatory Visit: Payer: Self-pay

## 2022-08-30 DIAGNOSIS — L089 Local infection of the skin and subcutaneous tissue, unspecified: Secondary | ICD-10-CM | POA: Diagnosis not present

## 2022-08-30 DIAGNOSIS — Z5321 Procedure and treatment not carried out due to patient leaving prior to being seen by health care provider: Secondary | ICD-10-CM | POA: Diagnosis not present

## 2022-08-30 NOTE — ED Triage Notes (Addendum)
Pt arrived via POV c/o infection to right 2nd digit on distal finger. Pt reports digging a splinter out of his finger X 1 week ago and the finger has since swollen up and has pus drainage.

## 2022-08-31 DIAGNOSIS — R2231 Localized swelling, mass and lump, right upper limb: Secondary | ICD-10-CM | POA: Diagnosis not present

## 2022-08-31 DIAGNOSIS — J45909 Unspecified asthma, uncomplicated: Secondary | ICD-10-CM | POA: Diagnosis not present

## 2022-08-31 DIAGNOSIS — N02B1 Recurrent and persistent immunoglobulin A nephropathy with glomerular lesion: Secondary | ICD-10-CM | POA: Diagnosis not present

## 2022-08-31 DIAGNOSIS — N028 Recurrent and persistent hematuria with other morphologic changes: Secondary | ICD-10-CM | POA: Diagnosis not present

## 2022-08-31 DIAGNOSIS — E785 Hyperlipidemia, unspecified: Secondary | ICD-10-CM | POA: Diagnosis not present

## 2022-08-31 DIAGNOSIS — M25562 Pain in left knee: Secondary | ICD-10-CM | POA: Diagnosis not present

## 2022-08-31 DIAGNOSIS — M659 Synovitis and tenosynovitis, unspecified: Secondary | ICD-10-CM | POA: Diagnosis not present

## 2022-08-31 DIAGNOSIS — Z87448 Personal history of other diseases of urinary system: Secondary | ICD-10-CM | POA: Diagnosis not present

## 2022-08-31 DIAGNOSIS — L539 Erythematous condition, unspecified: Secondary | ICD-10-CM | POA: Diagnosis not present

## 2022-08-31 DIAGNOSIS — M549 Dorsalgia, unspecified: Secondary | ICD-10-CM | POA: Diagnosis not present

## 2022-08-31 DIAGNOSIS — L02511 Cutaneous abscess of right hand: Secondary | ICD-10-CM | POA: Diagnosis not present

## 2022-08-31 DIAGNOSIS — L089 Local infection of the skin and subcutaneous tissue, unspecified: Secondary | ICD-10-CM | POA: Diagnosis not present

## 2022-08-31 DIAGNOSIS — M79644 Pain in right finger(s): Secondary | ICD-10-CM | POA: Diagnosis not present

## 2022-08-31 DIAGNOSIS — Z7951 Long term (current) use of inhaled steroids: Secondary | ICD-10-CM | POA: Diagnosis not present

## 2022-08-31 DIAGNOSIS — G8929 Other chronic pain: Secondary | ICD-10-CM | POA: Diagnosis not present

## 2022-08-31 DIAGNOSIS — Z79899 Other long term (current) drug therapy: Secondary | ICD-10-CM | POA: Diagnosis not present

## 2022-08-31 DIAGNOSIS — L03011 Cellulitis of right finger: Secondary | ICD-10-CM | POA: Diagnosis not present

## 2022-08-31 DIAGNOSIS — J45991 Cough variant asthma: Secondary | ICD-10-CM | POA: Diagnosis not present

## 2022-08-31 DIAGNOSIS — M65841 Other synovitis and tenosynovitis, right hand: Secondary | ICD-10-CM | POA: Diagnosis not present

## 2022-08-31 DIAGNOSIS — R69 Illness, unspecified: Secondary | ICD-10-CM | POA: Diagnosis not present

## 2022-08-31 DIAGNOSIS — E039 Hypothyroidism, unspecified: Secondary | ICD-10-CM | POA: Diagnosis not present

## 2022-08-31 DIAGNOSIS — I10 Essential (primary) hypertension: Secondary | ICD-10-CM | POA: Diagnosis not present

## 2022-08-31 DIAGNOSIS — M7989 Other specified soft tissue disorders: Secondary | ICD-10-CM | POA: Diagnosis not present

## 2022-09-01 DIAGNOSIS — L02511 Cutaneous abscess of right hand: Secondary | ICD-10-CM | POA: Diagnosis not present

## 2022-09-02 DIAGNOSIS — E785 Hyperlipidemia, unspecified: Secondary | ICD-10-CM | POA: Diagnosis not present

## 2022-09-02 DIAGNOSIS — E039 Hypothyroidism, unspecified: Secondary | ICD-10-CM | POA: Diagnosis not present

## 2022-09-02 DIAGNOSIS — I10 Essential (primary) hypertension: Secondary | ICD-10-CM | POA: Diagnosis not present

## 2022-09-02 DIAGNOSIS — L03011 Cellulitis of right finger: Secondary | ICD-10-CM | POA: Diagnosis not present

## 2022-09-02 DIAGNOSIS — R69 Illness, unspecified: Secondary | ICD-10-CM | POA: Diagnosis not present

## 2022-09-02 DIAGNOSIS — J45991 Cough variant asthma: Secondary | ICD-10-CM | POA: Diagnosis not present

## 2022-10-13 ENCOUNTER — Encounter: Payer: Self-pay | Admitting: *Deleted

## 2022-11-19 ENCOUNTER — Emergency Department (HOSPITAL_COMMUNITY): Payer: 59

## 2022-11-19 ENCOUNTER — Encounter (HOSPITAL_COMMUNITY): Payer: Self-pay

## 2022-11-19 ENCOUNTER — Other Ambulatory Visit: Payer: Self-pay

## 2022-11-19 ENCOUNTER — Telehealth (HOSPITAL_COMMUNITY): Payer: Self-pay | Admitting: Emergency Medicine

## 2022-11-19 ENCOUNTER — Emergency Department (HOSPITAL_COMMUNITY)
Admission: EM | Admit: 2022-11-19 | Discharge: 2022-11-19 | Disposition: A | Discharge status: Home / Self Care | Payer: 59 | Attending: Emergency Medicine | Admitting: Emergency Medicine

## 2022-11-19 DIAGNOSIS — W230XXA Caught, crushed, jammed, or pinched between moving objects, initial encounter: Secondary | ICD-10-CM | POA: Diagnosis not present

## 2022-11-19 DIAGNOSIS — S60511A Abrasion of right hand, initial encounter: Secondary | ICD-10-CM | POA: Diagnosis not present

## 2022-11-19 DIAGNOSIS — R600 Localized edema: Secondary | ICD-10-CM | POA: Diagnosis not present

## 2022-11-19 DIAGNOSIS — M25531 Pain in right wrist: Secondary | ICD-10-CM

## 2022-11-19 DIAGNOSIS — S6991XA Unspecified injury of right wrist, hand and finger(s), initial encounter: Secondary | ICD-10-CM | POA: Diagnosis present

## 2022-11-19 MED ORDER — HYDROCODONE-ACETAMINOPHEN 5-325 MG PO TABS
1.0000 | ORAL_TABLET | Freq: Once | ORAL | Status: AC
  Administered 2022-11-19: 1 via ORAL
  Filled 2022-11-19: qty 1

## 2022-11-19 MED ORDER — HYDROCODONE-ACETAMINOPHEN 5-325 MG PO TABS: ORAL_TABLET | ORAL | 0 refills | Status: DC

## 2022-11-19 NOTE — ED Provider Notes (Signed)
[facility] Provider Note   CSN: [id number] Arrival date & time: 11/19/22  1336     History  Chief Complaint  Patient presents with   Wrist Injury    Caleb Campbell is a 42 y.o. male.   Wrist Injury Associated symptoms: no fever        Caleb Campbell is a 42 y.o. male who presents to the Emergency Department complaining of right wrist pain since earlier today.  States he was [place] on a log [place] when another large log struck dorsal surface of his hand and wrist.  He complains of pain at the distal wrist that worsens with movement.  Swelling of the wrist is noted.  He denies any pain radiating into his forearm or into his fingers.  No numbness of his fingers.  No open wound.  He is right-hand dominant.  Home Medications Prior to Admission medications   Medication Sig Start Date End Date Taking? Authorizing Provider  HYDROcodone-acetaminophen (NORCO/VICODIN) 5-325 MG tablet Take one tab po q 4 hrs prn pain 11/19/22  Yes Willette Mudry, PA-C  albuterol (VENTOLIN HFA) 108 (90 Base) MCG/ACT inhaler Inhale into the lungs. 08/04/22 08/04/23  [provider]  amLODipine (NORVASC) 5 MG tablet Take 1 tablet by mouth daily. 07/29/22 07/29/23  [provider]  brompheniramine-pseudoephedrine-DM 30-2-10 MG/5ML syrup Take by mouth. 08/04/22   [provider]  doxycycline (VIBRAMYCIN) 100 MG capsule Take 100 mg by mouth 2 (two) times daily. 08/04/22   [provider]  gabapentin (NEURONTIN) 600 MG tablet Take 600 mg by mouth 3 (three) times daily. 08/22/22   [provider]  methocarbamol (ROBAXIN) 500 MG tablet Take 1 tablet (500 mg total) by mouth 4 (four) times daily. 08/26/22   Ivy Lynn, NP  predniSONE (DELTASONE) 20 MG tablet Take 1 tablet (20 mg total) by mouth daily with breakfast. 08/26/22   Ivy Lynn, NP      Allergies    Patient has no known allergies.    Review of  Systems   Review of Systems  Constitutional:  Negative for chills and fever.  Respiratory:  Negative for shortness of breath.   Cardiovascular:  Negative for chest pain.  Gastrointestinal:  Negative for abdominal pain, diarrhea and vomiting.  Musculoskeletal:  Positive for arthralgias (Right wrist pain) and joint swelling.  Skin:  Negative for wound.  Neurological:  Negative for weakness and numbness.    Physical Exam Updated Vital Signs BP (!) 121/90 (BP Location: Right Arm)   Pulse 95   Temp 98.2 F (36.8 C) (Oral)   Resp 16   Ht 6' (1.829 m)   Wt 81.6 kg   SpO2 100%   BMI 24.41 kg/m  Physical Exam Vitals and nursing note reviewed.  Constitutional:      General: He is not in acute distress.    Appearance: Normal appearance. He is not toxic-appearing.  Cardiovascular:     Rate and Rhythm: Normal rate and regular rhythm.     Pulses: Normal pulses.  Pulmonary:     Effort: Pulmonary effort is normal.  Musculoskeletal:        General: Swelling, tenderness and signs of injury present. No deformity.     Right wrist: Swelling, tenderness and bony tenderness present. No snuff box tenderness or crepitus. Decreased range of motion. Normal pulse.     Comments: Tender to palpation at the distal right wrist near the area of the scaphoid.  Mild to moderate edema noted of the wrist.  He has good cap refill.  Compartments of the extremity are soft  Skin:    General: Skin is warm.     Capillary Refill: Capillary refill takes less than 2 seconds.  Neurological:     General: No focal deficit present.     Mental Status: He is alert.     Sensory: No sensory deficit.     Motor: No weakness.     ED Results / Procedures / Treatments   Labs (all labs ordered are listed, but only abnormal results are displayed) Labs Reviewed - No data to display  EKG None  Radiology DG Wrist Complete Right  Result Date: 11/19/2022 CLINICAL DATA:  RIGHT wrist pain, caught RIGHT wrist between logs  this morning EXAM: RIGHT WRIST - COMPLETE 3+ VIEW COMPARISON:  None Available. FINDINGS: Osseous mineralization normal. Mild radiocarpal joint space narrowing. Joint spaces otherwise preserved. Tiny accessory ossicle adjacent to triquetrum. Questionable tiny cortical discontinuity at the scaphoid tubercle, favor artifact but recommend correlation for pain/tenderness at this site to exclude subtle fracture. No additional fracture, dislocation, or bone destruction. IMPRESSION: Suspected artifact at scaphoid tubercle, recommend correlation for pain/tenderness at this site to exclude subtle acute fracture. Mild radiocarpal joint space degenerative changes. No additional osseous abnormalities. Electronically Signed   By: Lavonia Dana M.D.   On: 11/19/2022 14:22    Procedures Procedures    Medications Ordered in ED Medications  HYDROcodone-acetaminophen (NORCO/VICODIN) 5-325 MG per tablet 1 tablet (1 tablet Oral Given 11/19/22 1544)    ED Course/ Medical Decision Making/ A&P                             Medical Decision Making Patient here for evaluation of right wrist pain after a direct blow from a log.  He has some swelling and pain along the distal wrist near the scaphoid.  Neurovascularly intact.  No open wound.  There is some small abrasions noted to the dorsal hand.  Neurovascularly intact.  Compartments are soft.  Clinically I suspect this may be a fracture, sprain also considered dislocation also considered.  Amount and/or Complexity of Data Reviewed Radiology: ordered.    Details: X-ray of the right wrist shows suspected artifact at the scaphoid tubercle cannot exclude subtle acute fracture Discussion of management or test interpretation with external provider(s): Clinical suspicion for scaphoid fracture.  Equivocal x-ray result.  Patient does have tenderness there.  Will apply sugar-tong splint and he is agreeable to close orthopedic follow-up.  Risk Prescription drug  management.           Final Clinical Impression(s) / ED Diagnoses Final diagnoses:  Right wrist pain    Rx / DC Orders ED Discharge Orders          Ordered    HYDROcodone-acetaminophen (NORCO/VICODIN) 5-325 MG tablet        11/19/22 1536              Kem Parkinson, PA-C 11/19/22 1555    Milton Ferguson, MD 11/20/22 [phone]

## 2022-11-19 NOTE — Telephone Encounter (Signed)
I was notified by nursing staff that patient's original pharmacy of record was unable to receive electronically sent prescription as the computers are down.  Prescription sent to [facility] which was patient's second choice.

## 2022-11-19 NOTE — ED Notes (Signed)
Pt verbalized understanding of no driving and to use caution within 4 hours of taking pain meds due to meds cause drowsiness 

## 2022-11-19 NOTE — ED Triage Notes (Signed)
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

## 2022-11-19 NOTE — Discharge Instructions (Signed)
Your x-ray today was concerning for a scaphoid fracture.  Elevate your right hand when possible, keep the wrist splinted.  Avoid excessive use of your right hand.  Call the orthopedic provider listed to arrange a follow-up appointment.

## 2022-11-20 ENCOUNTER — Ambulatory Visit (INDEPENDENT_AMBULATORY_CARE_PROVIDER_SITE_OTHER): Payer: 59 | Admitting: Family Medicine

## 2022-11-20 ENCOUNTER — Encounter: Payer: Self-pay | Admitting: Family Medicine

## 2022-11-20 VITALS — BP 127/85 | HR 100 | Temp 98.1°F | Ht 73.0 in | Wt 159.8 lb

## 2022-11-20 DIAGNOSIS — E785 Hyperlipidemia, unspecified: Secondary | ICD-10-CM | POA: Insufficient documentation

## 2022-11-20 DIAGNOSIS — N02B9 Other recurrent and persistent immunoglobulin A nephropathy: Secondary | ICD-10-CM | POA: Diagnosis not present

## 2022-11-20 DIAGNOSIS — F319 Bipolar disorder, unspecified: Secondary | ICD-10-CM | POA: Diagnosis not present

## 2022-11-20 DIAGNOSIS — E782 Mixed hyperlipidemia: Secondary | ICD-10-CM | POA: Diagnosis not present

## 2022-11-20 DIAGNOSIS — S62101A Fracture of unspecified carpal bone, right wrist, initial encounter for closed fracture: Secondary | ICD-10-CM

## 2022-11-20 DIAGNOSIS — E039 Hypothyroidism, unspecified: Secondary | ICD-10-CM

## 2022-11-20 MED ORDER — LOSARTAN POTASSIUM 25 MG PO TABS: 25.0000 mg | ORAL_TABLET | Freq: Every day | ORAL | 3 refills | Status: DC

## 2022-11-20 NOTE — Progress Notes (Signed)
Subjective:  Patient ID: Caleb Campbell, male    DOB: 02-Mar-1981, 42 y.o.   MRN: [id number]  Patient Care Team: Baruch Gouty, FNP as PCP - General (Family Medicine)   Chief Complaint:  Establish Care and Labs Only (Kidney check)   HPI: Caleb Campbell is a 42 y.o. male presenting on 11/20/2022 for Establish Care and Labs Only (Kidney check)  Pt presents today to establish care with new PCP and for management of chronic medical conditions.   1. IgA nephropathy determined by biopsy of kidney Ha snot been on medications in over 6 months. States he is voiding normally but has significant back pain. No hematuria. Does report weight loss. Was followed by specialists at [facility] in the past.   2. Bipolar 1 disorder ([place]) [facility] but does not wish to continue to see them as he was only seeing a person virtually. Would like referral to see someone in person. Not on medications at present and feels he is managing well. Denies mania or severe depressive symptoms.   3. Mixed hyperlipidemia Not on medications. Does watch his diet and is very active daily.   4. Acquired hypothyroidism Noted in EHR. Not on medications. Has had a loss of weight and depression, no other reported concerns.       Relevant past medical, surgical, family, and social history reviewed and updated as indicated.  Allergies and medications reviewed and updated. Data reviewed: Chart in Epic.   Past Medical History:  Diagnosis Date   IgA nephropathy     Past Surgical History:  Procedure Laterality Date   FINGER SURGERY     KNEE SURGERY      Social History   Socioeconomic History   Marital status: Divorced    Spouse name: Not on file   Number of children: 2   Years of education: Not on file   Highest education level: GED or equivalent  Occupational History   Not on file  Tobacco Use   Smoking status: Every Day    Packs/day: 0.50    Types: Cigarettes   Smokeless tobacco: Former     Types: Nurse, children's Use: Never used  Substance and Sexual Activity   Alcohol use: Not Currently   Drug use: Not Currently   Sexual activity: Yes  Other Topics Concern   Not on file  Social History Narrative   Not on file   Social Determinants of Health   Financial Resource Strain: Not on file  Food Insecurity: Not on file  Transportation Needs: Not on file  Physical Activity: Not on file  Stress: Not on file  Social Connections: Not on file  Intimate Partner Violence: Not on file    Outpatient Encounter Medications as of 11/20/2022  Medication Sig   HYDROcodone-acetaminophen (NORCO/VICODIN) 5-325 MG tablet Take one tab po q 4 hrs prn pain   losartan (COZAAR) 25 MG tablet Take 1 tablet (25 mg total) by mouth daily.   oxyCODONE (OXY IR/ROXICODONE) 5 MG immediate release tablet Take by mouth.   [DISCONTINUED] albuterol (VENTOLIN HFA) 108 (90 Base) MCG/ACT inhaler Inhale into the lungs.   [DISCONTINUED] amLODipine (NORVASC) 5 MG tablet Take 1 tablet by mouth daily.   [DISCONTINUED] brompheniramine-pseudoephedrine-DM 30-2-10 MG/5ML syrup Take by mouth.   [DISCONTINUED] doxycycline (VIBRAMYCIN) 100 MG capsule Take 100 mg by mouth 2 (two) times daily.   [DISCONTINUED] gabapentin (NEURONTIN) 600 MG tablet Take 600 mg by mouth 3 (three)  times daily.   [DISCONTINUED] methocarbamol (ROBAXIN) 500 MG tablet Take 1 tablet (500 mg total) by mouth 4 (four) times daily.   [DISCONTINUED] predniSONE (DELTASONE) 20 MG tablet Take 1 tablet (20 mg total) by mouth daily with breakfast.   No facility-administered encounter medications on file as of 11/20/2022.    Allergies  Allergen Reactions   Nsaids Other (See Comments)    Pt has chronic kidney disease and NSAIDs are contraindicated.   Bupropion Other (See Comments)    seizure    Review of Systems  Constitutional:  Positive for activity change, appetite change and unexpected weight change. Negative for chills, diaphoresis,  fatigue and fever.  HENT: Negative.    Eyes: Negative.  Negative for photophobia and visual disturbance.  Respiratory:  Negative for cough, chest tightness and shortness of breath.   Cardiovascular:  Negative for chest pain, palpitations and leg swelling.  Gastrointestinal:  Negative for abdominal pain, blood in stool, constipation, diarrhea, nausea and vomiting.  Endocrine: Negative.   Genitourinary:  Negative for decreased urine volume, difficulty urinating, dysuria, frequency and urgency.  Musculoskeletal:  Positive for arthralgias and joint swelling. Negative for myalgias.  Skin: Negative.   Allergic/Immunologic: Negative.   Neurological:  Negative for dizziness and headaches.  Hematological: Negative.   Psychiatric/Behavioral:  Positive for agitation, decreased concentration and sleep disturbance. Negative for behavioral problems, confusion, dysphoric mood, hallucinations, self-injury and suicidal ideas. The patient is nervous/anxious. The patient is not hyperactive.   All other systems reviewed and are negative.       Objective:  BP 127/85   Pulse 100   Temp 98.1 F (36.7 C) (Temporal)   Ht '6\' 1"'$  (1.854 m)   Wt 159 lb 12.8 oz (72.5 kg)   SpO2 97%   BMI 21.08 kg/m    Wt Readings from Last 3 Encounters:  11/20/22 159 lb 12.8 oz (72.5 kg)  11/19/22 180 lb (81.6 kg)  08/30/22 171 lb 15.3 oz (78 kg)    Physical Exam Vitals and nursing note reviewed.  Constitutional:      General: He is not in acute distress.    Appearance: Normal appearance. He is well-developed and well-groomed. He is not ill-appearing, toxic-appearing or diaphoretic.  HENT:     Head: Normocephalic and atraumatic.     Jaw: There is normal jaw occlusion.     Right Ear: Hearing normal.     Left Ear: Hearing normal.     Nose: Nose normal.     Mouth/Throat:     Lips: Pink.     Mouth: Mucous membranes are moist.     Pharynx: Oropharynx is clear. Uvula midline.  Eyes:     General: Lids are normal.      Extraocular Movements: Extraocular movements intact.     Conjunctiva/sclera: Conjunctivae normal.     Pupils: Pupils are equal, round, and reactive to light.  Neck:     Thyroid: No thyroid mass, thyromegaly or thyroid tenderness.     Vascular: No carotid bruit or JVD.     Trachea: Trachea and phonation normal.  Cardiovascular:     Rate and Rhythm: Normal rate and regular rhythm.     Chest Wall: PMI is not displaced.     Pulses: Normal pulses.     Heart sounds: Normal heart sounds. No murmur heard.    No friction rub. No gallop.  Pulmonary:     Effort: Pulmonary effort is normal. No respiratory distress.     Breath sounds: Normal breath sounds. No  wheezing.  Abdominal:     General: Bowel sounds are normal. There is no distension or abdominal bruit.     Palpations: Abdomen is soft. There is no hepatomegaly or splenomegaly.     Tenderness: There is no abdominal tenderness. There is no right CVA tenderness or left CVA tenderness.     Hernia: No hernia is present.  Musculoskeletal:        General: Normal range of motion.     Cervical back: Normal range of motion and neck supple.     Right lower leg: No edema.     Left lower leg: No edema.     Comments: Sugartong splint on right arm  Lymphadenopathy:     Cervical: No cervical adenopathy.  Skin:    General: Skin is warm and dry.     Capillary Refill: Capillary refill takes less than 2 seconds.     Coloration: Skin is not cyanotic, jaundiced or pale.     Findings: No rash.  Neurological:     General: No focal deficit present.     Mental Status: He is alert and oriented to person, place, and time.     Sensory: Sensation is intact.     Motor: Motor function is intact.     Coordination: Coordination is intact.     Gait: Gait is intact.     Deep Tendon Reflexes: Reflexes are normal and symmetric.  Psychiatric:        Attention and Perception: Attention and perception normal.        Mood and Affect: Affect normal. Mood is anxious.         Speech: Speech is rapid and pressured.        Behavior: Behavior normal. Behavior is cooperative.        Thought Content: Thought content normal.        Cognition and Memory: Cognition and memory normal.        Judgment: Judgment normal.     Results for orders placed or performed in visit on 08/26/22  CBC with Differential  Result Value Ref Range   WBC 8.1 3.4 - 10.8 x10E3/uL   RBC 4.71 4.14 - 5.80 x10E6/uL   Hemoglobin 14.8 13.0 - 17.7 g/dL   Hematocrit 45.0 37.5 - 51.0 %   MCV 96 79 - 97 fL   MCH 31.4 26.6 - 33.0 pg   MCHC 32.9 31.5 - 35.7 g/dL   RDW 11.8 11.6 - 15.4 %   Platelets 290 150 - 450 x10E3/uL   Neutrophils 62 Not Estab. %   Lymphs 23 Not Estab. %   Monocytes 8 Not Estab. %   Eos 6 Not Estab. %   Basos 1 Not Estab. %   Neutrophils Absolute 5.0 1.4 - 7.0 x10E3/uL   Lymphocytes Absolute 1.9 0.7 - 3.1 x10E3/uL   Monocytes Absolute 0.7 0.1 - 0.9 x10E3/uL   EOS (ABSOLUTE) 0.5 (H) 0.0 - 0.4 x10E3/uL   Basophils Absolute 0.1 0.0 - 0.2 x10E3/uL   Immature Granulocytes 0 Not Estab. %   Immature Grans (Abs) 0.0 0.0 - 0.1 x10E3/uL  CMP14+EGFR  Result Value Ref Range   Glucose 69 (L) 70 - 99 mg/dL   BUN 15 6 - 24 mg/dL   Creatinine, Ser 0.98 0.76 - 1.27 mg/dL   eGFR 99 >59 mL/min/1.73   BUN/Creatinine Ratio 15 9 - 20   Sodium 142 134 - 144 mmol/L   Potassium 4.3 3.5 - 5.2 mmol/L   Chloride 103 96 - 106  mmol/L   CO2 25 20 - 29 mmol/L   Calcium 9.1 8.7 - 10.2 mg/dL   Total Protein 6.4 6.0 - 8.5 g/dL   Albumin 4.1 4.1 - 5.1 g/dL   Globulin, Total 2.3 1.5 - 4.5 g/dL   Albumin/Globulin Ratio 1.8 1.2 - 2.2   Bilirubin Total 0.2 0.0 - 1.2 mg/dL   Alkaline Phosphatase 88 44 - 121 IU/L   AST 21 0 - 40 IU/L   ALT 17 0 - 44 IU/L  Lipid Panel  Result Value Ref Range   Cholesterol, Total 184 100 - 199 mg/dL   Triglycerides 206 (H) 0 - 149 mg/dL   HDL 51 >39 mg/dL   VLDL Cholesterol Cal 35 5 - 40 mg/dL   LDL Chol Calc (NIH) 98 0 - 99 mg/dL   Chol/HDL Ratio 3.6 0.0 -  5.0 ratio       Pertinent labs & imaging results that were available during my care of the patient were reviewed by me and considered in my medical decision making.  Assessment & Plan:  Ziyon was seen today for establish care and labs only.  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 Will check labs today, restart ARB, and place referral to nephrology.  -     CBC with Differential/Platelet -     CMP14+EGFR -     Ambulatory referral to Nephrology -     losartan (COZAAR) 25 MG tablet; Take 1 tablet (25 mg total) by mouth daily.  Bipolar 1 disorder ([place]) Currently not on medications. Was seeing [facility] and does not wish to see them again. Will place referral to psychiatry today.  -     CBC with Differential/Platelet -     CMP14+EGFR -     Thyroid Panel With TSH -     Ambulatory referral to Psychiatry  Mixed hyperlipidemia Labs pending, diet and exercise encouraged.  -     Lipid panel  Acquired hypothyroidism Not on medications. No recent labs. Will check today and restart medications if warranted.  -     Thyroid Panel With TSH  Closed fracture of right wrist, initial encounter Diagnosed in ED, has not followed up with ortho, referral placed.  -     Ambulatory referral to Orthopedic Surgery     Continue all other maintenance medications.  Follow up plan: Return in about 3 months (around 02/18/2023) for thyroid, renal.   Continue healthy lifestyle choices, including diet (rich in fruits, vegetables, and lean proteins, and low in salt and simple carbohydrates) and exercise (at least 30 minutes of moderate physical activity daily).  Educational handout given for health maintenance  The above assessment and management plan was discussed with the patient. The patient verbalized understanding of and has agreed to the management plan. Patient is aware to call the clinic if they  develop any new symptoms or if symptoms persist or worsen. Patient is aware when to return to the clinic for a follow-up visit. Patient educated on when it is appropriate to go to the emergency department.   Monia Pouch, FNP-C [facility] [phone]

## 2022-11-21 LAB — CMP14+EGFR
ALT: 16 IU/L (ref 0–44)
AST: 22 IU/L (ref 0–40)
Albumin/Globulin Ratio: 2 (ref 1.2–2.2)
Albumin: 4.5 g/dL (ref 4.1–5.1)
Alkaline Phosphatase: 86 IU/L (ref 44–121)
BUN/Creatinine Ratio: 17 (ref 9–20)
BUN: 18 mg/dL (ref 6–24)
Bilirubin Total: 0.6 mg/dL (ref 0.0–1.2)
CO2: 24 mmol/L (ref 20–29)
Calcium: 9.7 mg/dL (ref 8.7–10.2)
Chloride: 101 mmol/L (ref 96–106)
Creatinine, Ser: 1.03 mg/dL (ref 0.76–1.27)
Globulin, Total: 2.2 g/dL (ref 1.5–4.5)
Glucose: 83 mg/dL (ref 70–99)
Potassium: 4.9 mmol/L (ref 3.5–5.2)
Sodium: 144 mmol/L (ref 134–144)
Total Protein: 6.7 g/dL (ref 6.0–8.5)
eGFR: 92 mL/min/{1.73_m2} (ref >59)

## 2022-11-21 LAB — CBC WITH DIFFERENTIAL/PLATELET
Basophils Absolute: 0.1 10*3/uL (ref 0.0–0.2)
Basos: 1 %
EOS (ABSOLUTE): 0.2 10*3/uL (ref 0.0–0.4)
Eos: 2 %
Hematocrit: 46.7 % (ref 37.5–51.0)
Hemoglobin: 15.7 g/dL (ref 13.0–17.7)
Immature Grans (Abs): 0 10*3/uL (ref 0.0–0.1)
Immature Granulocytes: 0 %
Lymphocytes Absolute: 2.9 10*3/uL (ref 0.7–3.1)
Lymphs: 32 %
MCH: 31.7 pg (ref 26.6–33.0)
MCHC: 33.6 g/dL (ref 31.5–35.7)
MCV: 94 fL (ref 79–97)
Monocytes Absolute: 0.8 10*3/uL (ref 0.1–0.9)
Monocytes: 8 %
Neutrophils Absolute: 5.1 10*3/uL (ref 1.4–7.0)
Neutrophils: 57 %
Platelets: 343 10*3/uL (ref 150–450)
RBC: 4.95 x10E6/uL (ref 4.14–5.80)
RDW: 12.5 % (ref 11.6–15.4)
WBC: 9 10*3/uL (ref 3.4–10.8)

## 2022-11-21 LAB — THYROID PANEL WITH TSH
Free Thyroxine Index: 2.3 (ref 1.2–4.9)
T3 Uptake Ratio: 31 % (ref 24–39)
T4, Total: 7.4 ug/dL (ref 4.5–12.0)
TSH: 1.21 u[IU]/mL (ref 0.450–4.500)

## 2022-11-21 LAB — LIPID PANEL
Chol/HDL Ratio: 3.1 ratio (ref 0.0–5.0)
Cholesterol, Total: 193 mg/dL (ref 100–199)
HDL: 62 mg/dL (ref >39)
LDL Chol Calc (NIH): 116 mg/dL — ABNORMAL HIGH (ref 0–99)
Triglycerides: 82 mg/dL (ref 0–149)
VLDL Cholesterol Cal: 15 mg/dL (ref 5–40)

## 2022-11-24 ENCOUNTER — Ambulatory Visit: Payer: 59 | Admitting: Orthopedic Surgery

## 2023-08-31 ENCOUNTER — Encounter: Payer: 59 | Admitting: Nurse Practitioner

## 2023-08-31 ENCOUNTER — Ambulatory Visit (INDEPENDENT_AMBULATORY_CARE_PROVIDER_SITE_OTHER): Payer: Self-pay | Admitting: Family Medicine

## 2023-08-31 ENCOUNTER — Encounter: Payer: Self-pay | Admitting: Family Medicine

## 2023-08-31 VITALS — BP 125/79 | HR 115 | Temp 97.2°F | Ht 73.0 in | Wt 155.0 lb

## 2023-08-31 DIAGNOSIS — Z0000 Encounter for general adult medical examination without abnormal findings: Secondary | ICD-10-CM

## 2023-08-31 DIAGNOSIS — Z0001 Encounter for general adult medical examination with abnormal findings: Secondary | ICD-10-CM

## 2023-08-31 DIAGNOSIS — Z13220 Encounter for screening for lipoid disorders: Secondary | ICD-10-CM

## 2023-08-31 DIAGNOSIS — N02B9 Other recurrent and persistent immunoglobulin A nephropathy: Secondary | ICD-10-CM

## 2023-08-31 DIAGNOSIS — F1911 Other psychoactive substance abuse, in remission: Secondary | ICD-10-CM

## 2023-08-31 DIAGNOSIS — Z131 Encounter for screening for diabetes mellitus: Secondary | ICD-10-CM

## 2023-08-31 DIAGNOSIS — Z114 Encounter for screening for human immunodeficiency virus [HIV]: Secondary | ICD-10-CM

## 2023-08-31 DIAGNOSIS — Z130 Encounter for screening for diseases of the blood and blood-forming organs and certain disorders involving the immune mechanism: Secondary | ICD-10-CM

## 2023-08-31 DIAGNOSIS — J3489 Other specified disorders of nose and nasal sinuses: Secondary | ICD-10-CM | POA: Insufficient documentation

## 2023-08-31 DIAGNOSIS — F319 Bipolar disorder, unspecified: Secondary | ICD-10-CM

## 2023-08-31 DIAGNOSIS — Z1329 Encounter for screening for other suspected endocrine disorder: Secondary | ICD-10-CM

## 2023-08-31 DIAGNOSIS — Z1159 Encounter for screening for other viral diseases: Secondary | ICD-10-CM

## 2023-08-31 LAB — BAYER DCA HB A1C WAIVED: HB A1C (BAYER DCA - WAIVED): 4.9 % (ref 4.8–5.6)

## 2023-08-31 MED ORDER — LEVOCETIRIZINE DIHYDROCHLORIDE 5 MG PO TABS: 5.0000 mg | ORAL_TABLET | Freq: Every evening | ORAL | 1 refills | Status: DC

## 2023-08-31 MED ORDER — FLUTICASONE PROPIONATE 50 MCG/ACT NA SUSP: 2.0000 | Freq: Every day | NASAL | 6 refills | Status: DC

## 2023-08-31 MED ORDER — LOSARTAN POTASSIUM 25 MG PO TABS: 25.0000 mg | ORAL_TABLET | Freq: Every day | ORAL | 3 refills | Status: DC

## 2023-08-31 NOTE — Progress Notes (Signed)
Complete physical exam  Patient: Caleb Campbell   DOB: 1981/09/07   42 y.o. Male  MRN: [id number]  Subjective:    Chief Complaint  Patient presents with   Annual Exam    Caleb Campbell is a 42 y.o. male who presents today for a complete physical exam. He reports consuming a general diet. The patient does not participate in regular exercise at present. He generally feels well. He reports sleeping well. He does not have additional problems to discuss today.   Discussed the use of AI scribe software for clinical note transcription with the patient, who gave verbal consent to proceed.  History of Present Illness   The patient, with a history of intravenous drug use and nephropathy, presents for a routine physical. He reports a recent injury to the toe, which occurred about a month ago when he kicked a bag. The toe was swollen and red for about three weeks, but the patient believes it is now on the mend. He also mentions ongoing issues with his knee, but feels it is improving.  The patient has been experiencing chronic rhinorrhea, which he attributes to a deviated septum and past cocaine use. He has tried various treatments in the past, including nasal sprays and allergy pills, but has found little relief. He expresses interest in a surgical solution for the deviated septum once his insurance situation is resolved.  The patient also discusses his past substance use, including crystal meth and cocaine. He has been sober for a period of time, which included a twelve-day jail stay. Currently, he is using over-the-counter CBD for management. He expresses interest in a drug screening to ensure the absence of THC.  The patient has not been taking his prescribed losartan for about eight months due to financial constraints. He has been managing his health with natural remedies, including dandelion tea and local honey. He expresses interest in understanding his current kidney function.      Most  recent fall risk assessment:    08/31/2023    3:05 PM  Fall Risk   Falls in the past year? 0     Most recent depression screenings:    08/31/2023    3:05 PM 08/26/2022   11:43 AM  PHQ 2/9 Scores  PHQ - 2 Score 1 0  PHQ- 9 Score 1 0    Vision:Not within last year  and Dental: No current dental problems and No regular dental care   Patient Active Problem List   Diagnosis Date Noted   Rhinorrhea 08/31/2023   Hyperlipidemia 11/20/2022   Hypothyroid 11/20/2022   Bipolar 1 disorder (H[facility]) 11/20/2022   IgA nephropathy determined by biopsy of kidney 11/20/2022   Past Medical History:  Diagnosis Date   IgA nephropathy    Past Surgical History:  Procedure Laterality Date   FINGER SURGERY     KNEE SURGERY     Social History   Tobacco Use   Smoking status: Every Day    Current packs/day: 0.50    Types: Cigarettes   Smokeless tobacco: Former    Types: Engineer, drilling   Vaping status: Never Used  Substance Use Topics   Alcohol use: Not Currently   Drug use: Not Currently   Social History   Socioeconomic History   Marital status: Divorced    Spouse name: Not on file   Number of children: 2   Years of education: Not on file   Highest education level: GED or equivalent  Occupational  History   Not on file  Tobacco Use   Smoking status: Every Day    Current packs/day: 0.50    Types: Cigarettes   Smokeless tobacco: Former    Types: Engineer, drilling   Vaping status: Never Used  Substance and Sexual Activity   Alcohol use: Not Currently   Drug use: Not Currently   Sexual activity: Yes  Other Topics Concern   Not on file  Social History Narrative   Not on file   Social Determinants of Health   Financial Resource Strain: Not on file  Food Insecurity: Not on file  Transportation Needs: Not on file  Physical Activity: Not on file  Stress: Not on file  Social Connections: Not on file  Intimate Partner Violence: Not on file   Family Status  Relation Name  Status   Mother  Alive   Father  Alive  No partnership data on file   History reviewed. No pertinent family history. Allergies  Allergen Reactions   Nsaids Other (See Comments)    Pt has chronic kidney disease and NSAIDs are contraindicated.   Bupropion Other (See Comments)    seizure      Patient Care Team: Sonny Masters, FNP as PCP - General (Family Medicine)   Outpatient Medications Prior to Visit  Medication Sig   [DISCONTINUED] losartan (COZAAR) 25 MG tablet Take 1 tablet (25 mg total) by mouth daily. (Patient taking differently: Take 100 mg by mouth daily.)   [DISCONTINUED] HYDROcodone-acetaminophen (NORCO/VICODIN) 5-325 MG tablet Take one tab po q 4 hrs prn pain   [DISCONTINUED] oxyCODONE (OXY IR/ROXICODONE) 5 MG immediate release tablet Take by mouth.   No facility-administered medications prior to visit.    Review of Systems  Constitutional:  Negative for chills, diaphoresis, fever, malaise/fatigue and weight loss.  All other systems reviewed and are negative.         Objective:     BP 125/79   Pulse (!) 115   Temp (!) 97.2 F (36.2 C) (Temporal)   Ht 6\' 1"  (1.854 m)   Wt 155 lb (70.3 kg)   SpO2 99%   BMI 20.45 kg/m  BP Readings from Last 3 Encounters:  08/31/23 125/79  11/20/22 127/85  11/19/22 (!) 121/90   Wt Readings from Last 3 Encounters:  08/31/23 155 lb (70.3 kg)  11/20/22 159 lb 12.8 oz (72.5 kg)  11/19/22 180 lb (81.6 kg)   SpO2 Readings from Last 3 Encounters:  08/31/23 99%  11/20/22 97%  11/19/22 100%      Physical Exam Vitals and nursing note reviewed.  Constitutional:      General: He is not in acute distress.    Appearance: Normal appearance. He is normal weight. He is not ill-appearing, toxic-appearing or diaphoretic.  HENT:     Head: Normocephalic and atraumatic.     Right Ear: Tympanic membrane, ear canal and external ear normal.     Left Ear: Tympanic membrane, ear canal and external ear normal.     Nose: Nose  normal.     Mouth/Throat:     Mouth: Mucous membranes are moist.     Pharynx: Oropharynx is clear.  Eyes:     Conjunctiva/sclera: Conjunctivae normal.     Pupils: Pupils are equal, round, and reactive to light.  Cardiovascular:     Rate and Rhythm: Normal rate and regular rhythm.     Heart sounds: Normal heart sounds.  Pulmonary:     Effort: Pulmonary effort is normal.  Breath sounds: Normal breath sounds.  Abdominal:     General: Bowel sounds are normal.     Palpations: Abdomen is soft.  Musculoskeletal:        General: Normal range of motion.     Cervical back: Normal range of motion and neck supple.     Right lower leg: No edema.     Left lower leg: No edema.  Skin:    General: Skin is warm and dry.     Capillary Refill: Capillary refill takes less than 2 seconds.  Neurological:     General: No focal deficit present.     Mental Status: He is alert and oriented to person, place, and time.  Psychiatric:        Mood and Affect: Mood normal.        Behavior: Behavior normal.        Thought Content: Thought content normal.        Judgment: Judgment normal.       Last CBC Lab Results  Component Value Date   WBC 9.0 11/20/2022   HGB 15.7 11/20/2022   HCT 46.7 11/20/2022   MCV 94 11/20/2022   MCH 31.7 11/20/2022   RDW 12.5 11/20/2022   PLT 343 11/20/2022   Last metabolic panel Lab Results  Component Value Date   GLUCOSE 83 11/20/2022   NA 144 11/20/2022   K 4.9 11/20/2022   CL 101 11/20/2022   CO2 24 11/20/2022   BUN 18 11/20/2022   CREATININE 1.03 11/20/2022   EGFR 92 11/20/2022   CALCIUM 9.7 11/20/2022   PROT 6.7 11/20/2022   ALBUMIN 4.5 11/20/2022   LABGLOB 2.2 11/20/2022   AGRATIO 2.0 11/20/2022   BILITOT 0.6 11/20/2022   ALKPHOS 86 11/20/2022   AST 22 11/20/2022   ALT 16 11/20/2022   ANIONGAP 7 03/31/2022   Last lipids Lab Results  Component Value Date   CHOL 193 11/20/2022   HDL 62 11/20/2022   LDLCALC 116 (H) 11/20/2022   TRIG 82  11/20/2022   CHOLHDL 3.1 11/20/2022    Last thyroid functions Lab Results  Component Value Date   TSH 1.210 11/20/2022   T4TOTAL 7.4 11/20/2022       Assessment & Plan:    Routine Health Maintenance and Physical Exam  Immunization History  Administered Date(s) Administered   Influenza,inj,Quad PF,6+ Mos 07/08/2017, 07/07/2018   Td (Adult),5 Lf Tetanus Toxid, Preservative Free 01/05/1996, 09/28/2006, 08/31/2022   Tetanus 06/27/2005    Health Maintenance  Topic Date Due   HIV Screening  Never done   Hepatitis C Screening  Never done   COVID-19 Vaccine (1 - 2023-24 season) 09/16/2023 (Originally 05/30/2023)   INFLUENZA VACCINE  12/27/2023 (Originally 04/29/2023)   DTaP/Tdap/Td (2 - Tdap) 08/30/2024 (Originally 09/01/2022)   HPV VACCINES  Aged Out    Discussed health benefits of physical activity, and encouraged him to engage in regular exercise appropriate for his age and condition.  Problem List Items Addressed This Visit       Genitourinary   IgA nephropathy determined by biopsy of kidney   Relevant Medications   losartan (COZAAR) 25 MG tablet     Other   Bipolar 1 disorder (HCC)   Relevant Orders   Thyroid Panel With TSH   Rhinorrhea   Relevant Medications   fluticasone (FLONASE) 50 MCG/ACT nasal spray   levocetirizine (XYZAL) 5 MG tablet   Other Visit Diagnoses     Annual physical exam    -  Primary  Relevant Orders   CMP14+EGFR   CBC with Differential/Platelet   Lipid panel   Thyroid Panel With TSH   HepB+HepC+HIV Panel   Drug Screen 10 W/Conf, Serum   Bayer DCA Hb A1c Waived   History of drug abuse (HCC)       Relevant Orders   CMP14+EGFR   HepB+HepC+HIV Panel   Drug Screen 10 W/Conf, Serum   Screening for lipid disorders       Relevant Orders   Lipid panel   Screening for deficiency anemia       Relevant Orders   CBC with Differential/Platelet   Screening for HIV (human immunodeficiency virus)       Relevant Orders   HepB+HepC+HIV Panel    Encounter for hepatitis C screening test for low risk patient       Relevant Orders   HepB+HepC+HIV Panel   Need for hepatitis B screening test       Relevant Orders   HepB+HepC+HIV Panel   Screening for diabetes mellitus       Relevant Orders   CMP14+EGFR   Bayer DCA Hb A1c Waived   Screening for thyroid disorder       Relevant Orders   Thyroid Panel With TSH     Assessment and Plan    Physical Examination Routine physical examination. No new or concerning issues reported since the last visit. - Perform physical examination - Order lab work  Toe Injury Possible toe fracture from trauma (kicking a bag) one month ago. Symptoms include initial swelling and redness, now improving.  Knee Pain Intermittent knee pain, improving. No acute intervention required at this time.  Substance Use Disorder Crystal methamphetamine use, currently sober. Uses CBD products legally. No current use of other street drugs. Discussed maintaining sobriety and potential relapse risks. - Perform drug screening  Hypertension Hypertension, previously managed with losartan 100 mg daily. Has not taken losartan for eight months due to affordability issues. Blood pressure generally controlled with lifestyle modifications. Discussed importance of blood pressure control to prevent complications such as stroke and kidney damage. Prescribed lower dose of losartan to minimize side effects and monitor response. - Prescribe losartan 25 mg daily - Monitor blood pressure  Chronic Kidney Disease (CKD) CKD with nephropathy. Emphasized importance of blood pressure control to protect kidney function. Discussed need for regular monitoring of renal function. - Order renal function tests  Chronic Rhinitis Chronic nasal congestion and rhinorrhea, likely exacerbated by a deviated septum. Previous treatments with Flonase and Mucinex DM provided limited relief. Discussed combination of Xyzal and Flonase. If symptoms persist,  referral to ENT for evaluation and potential surgery for deviated septum. - Prescribe Xyzal - Prescribe Flonase - Refer to ENT for evaluation and potential surgery for deviated septum  General Health Maintenance Discussion of natural remedies for throat irritation and general wellness. Emphasis on using local honey with plastic or wood spoons to preserve beneficial spores. - Recommend local honey with plastic or wood spoons - Recommend elderberry and peppermint for throat care  Follow-up - Follow up after insurance is sorted for ENT referral - Follow up for lab results and renal function tests.       Return in about 1 year (around 08/30/2024) for CPE.     Kari Baars, FNP

## 2023-09-08 LAB — CMP14+EGFR
ALT: 19 [IU]/L (ref 0–44)
AST: 20 [IU]/L (ref 0–40)
Albumin: 4.2 g/dL (ref 4.1–5.1)
Alkaline Phosphatase: 97 [IU]/L (ref 44–121)
BUN/Creatinine Ratio: 24 — ABNORMAL HIGH (ref 9–20)
BUN: 28 mg/dL — ABNORMAL HIGH (ref 6–24)
Bilirubin Total: 0.2 mg/dL (ref 0.0–1.2)
CO2: 20 mmol/L (ref 20–29)
Calcium: 9.9 mg/dL (ref 8.7–10.2)
Chloride: 103 mmol/L (ref 96–106)
Creatinine, Ser: 1.15 mg/dL (ref 0.76–1.27)
Globulin, Total: 2.5 g/dL (ref 1.5–4.5)
Glucose: 78 mg/dL (ref 70–99)
Potassium: 4.8 mmol/L (ref 3.5–5.2)
Sodium: 138 mmol/L (ref 134–144)
Total Protein: 6.7 g/dL (ref 6.0–8.5)
eGFR: 80 mL/min/{1.73_m2} (ref >59)

## 2023-09-08 LAB — LIPID PANEL
Chol/HDL Ratio: 3 {ratio} (ref 0.0–5.0)
Cholesterol, Total: 207 mg/dL — ABNORMAL HIGH (ref 100–199)
HDL: 70 mg/dL (ref >39)
LDL Chol Calc (NIH): 115 mg/dL — ABNORMAL HIGH (ref 0–99)
Triglycerides: 124 mg/dL (ref 0–149)
VLDL Cholesterol Cal: 22 mg/dL (ref 5–40)

## 2023-09-08 LAB — CBC WITH DIFFERENTIAL/PLATELET
Basophils Absolute: 0.1 10*3/uL (ref 0.0–0.2)
Basos: 1 %
EOS (ABSOLUTE): 0.1 10*3/uL (ref 0.0–0.4)
Eos: 1 %
Hematocrit: 47.6 % (ref 37.5–51.0)
Hemoglobin: 15.4 g/dL (ref 13.0–17.7)
Immature Grans (Abs): 0 10*3/uL (ref 0.0–0.1)
Immature Granulocytes: 0 %
Lymphocytes Absolute: 2 10*3/uL (ref 0.7–3.1)
Lymphs: 18 %
MCH: 31.8 pg (ref 26.6–33.0)
MCHC: 32.4 g/dL (ref 31.5–35.7)
MCV: 98 fL — ABNORMAL HIGH (ref 79–97)
Monocytes Absolute: 0.9 10*3/uL (ref 0.1–0.9)
Monocytes: 9 %
Neutrophils Absolute: 7.6 10*3/uL — ABNORMAL HIGH (ref 1.4–7.0)
Neutrophils: 71 %
Platelets: 344 10*3/uL (ref 150–450)
RBC: 4.84 x10E6/uL (ref 4.14–5.80)
RDW: 11.6 % (ref 11.6–15.4)
WBC: 10.7 10*3/uL (ref 3.4–10.8)

## 2023-09-08 LAB — HEPB+HEPC+HIV PANEL
HIV Screen 4th Generation wRfx: NONREACTIVE
Hep B C IgM: NEGATIVE
Hep B Core Total Ab: NEGATIVE
Hep B E Ab: NONREACTIVE
Hep B E Ag: NEGATIVE
Hep B Surface Ab, Qual: REACTIVE
Hep C Virus Ab: NONREACTIVE
Hepatitis B Surface Ag: NEGATIVE

## 2023-09-08 LAB — THC,MS,WB/SP RFX
Cannabidiol: NEGATIVE ng/mL
Cannabinoid Confirmation: POSITIVE
Carboxy-THC: 60.3 ng/mL
Hydroxy-THC: 6.9 ng/mL
Tetrahydrocannabinol(THC): 17.1 ng/mL

## 2023-09-08 LAB — DRUG SCREEN 10 W/CONF, SERUM
Amphetamines, IA: NEGATIVE ng/mL
Barbiturates, IA: NEGATIVE ug/mL
Benzodiazepines, IA: NEGATIVE ng/mL
Cocaine & Metabolite, IA: NEGATIVE ng/mL
Methadone, IA: NEGATIVE ng/mL
Opiates, IA: NEGATIVE ng/mL
Oxycodones, IA: NEGATIVE ng/mL
Phencyclidine, IA: NEGATIVE ng/mL
Propoxyphene, IA: NEGATIVE ng/mL

## 2023-09-08 LAB — THYROID PANEL WITH TSH
Free Thyroxine Index: 1.3 (ref 1.2–4.9)
T3 Uptake Ratio: 24 % (ref 24–39)
T4, Total: 5.5 ug/dL (ref 4.5–12.0)
TSH: 0.655 u[IU]/mL (ref 0.450–4.500)

## 2024-01-21 ENCOUNTER — Telehealth: Payer: Self-pay

## 2024-01-21 DIAGNOSIS — Z008 Encounter for other general examination: Secondary | ICD-10-CM

## 2024-01-21 NOTE — Telephone Encounter (Signed)
 Copied from CRM [id number]. Topic: Clinical - Request for Lab/Test Order >> Jan 21, 2024  1:05 PM Caleb Campbell wrote: Reason for CRM: Patient called. He is submitting application on Sunday to get into a rehab program. He will be away for months. In order to get in he needs to show proof of blood test and TB test. Would like to get the schedule as soon as he can. 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 His dad and church are helping him. He spoke with someone in billing and knows he owes money. Currently does not have insurance but states his dad will help him pay for appt. Thank You

## 2024-01-21 NOTE — Telephone Encounter (Signed)
 Pt scheduled with Nurse for PPD and labs. Pt doesn't need to be seen per Rakes.

## 2024-01-24 ENCOUNTER — Other Ambulatory Visit: Payer: Self-pay

## 2024-01-25 ENCOUNTER — Ambulatory Visit: Payer: Self-pay

## 2024-01-27 ENCOUNTER — Other Ambulatory Visit: Payer: Self-pay

## 2024-01-27 ENCOUNTER — Ambulatory Visit: Payer: Self-pay

## 2024-01-27 DIAGNOSIS — F1911 Other psychoactive substance abuse, in remission: Secondary | ICD-10-CM

## 2024-01-27 DIAGNOSIS — Z111 Encounter for screening for respiratory tuberculosis: Secondary | ICD-10-CM

## 2024-01-27 DIAGNOSIS — Z008 Encounter for other general examination: Secondary | ICD-10-CM

## 2024-01-27 NOTE — Progress Notes (Signed)
 Patient was in today to have TB skin test read. Patient didn't show up to appointment on Tuesday to have it placed. Patient was wanting labs drawn for facility placement, labs were ordered and collected.

## 2024-01-30 LAB — CBC WITH DIFFERENTIAL/PLATELET
Basophils Absolute: 0.1 10*3/uL (ref 0.0–0.2)
Basos: 1 %
EOS (ABSOLUTE): 0.3 10*3/uL (ref 0.0–0.4)
Eos: 4 %
Hematocrit: 44.3 % (ref 37.5–51.0)
Hemoglobin: 14.6 g/dL (ref 13.0–17.7)
Immature Grans (Abs): 0 10*3/uL (ref 0.0–0.1)
Immature Granulocytes: 0 %
Lymphocytes Absolute: 2.9 10*3/uL (ref 0.7–3.1)
Lymphs: 41 %
MCH: 31.8 pg (ref 26.6–33.0)
MCHC: 33 g/dL (ref 31.5–35.7)
MCV: 97 fL (ref 79–97)
Monocytes Absolute: 0.7 10*3/uL (ref 0.1–0.9)
Monocytes: 11 %
Neutrophils Absolute: 3 10*3/uL (ref 1.4–7.0)
Neutrophils: 43 %
Platelets: 311 10*3/uL (ref 150–450)
RBC: 4.59 x10E6/uL (ref 4.14–5.80)
RDW: 11.9 % (ref 11.6–15.4)
WBC: 7 10*3/uL (ref 3.4–10.8)

## 2024-01-30 LAB — CMP14+EGFR
ALT: 17 IU/L (ref 0–44)
AST: 24 IU/L (ref 0–40)
Albumin: 4.4 g/dL (ref 4.1–5.1)
Alkaline Phosphatase: 110 IU/L (ref 44–121)
BUN/Creatinine Ratio: 20 (ref 9–20)
BUN: 25 mg/dL — ABNORMAL HIGH (ref 6–24)
Bilirubin Total: 0.3 mg/dL (ref 0.0–1.2)
CO2: 23 mmol/L (ref 20–29)
Calcium: 9.3 mg/dL (ref 8.7–10.2)
Chloride: 104 mmol/L (ref 96–106)
Creatinine, Ser: 1.27 mg/dL (ref 0.76–1.27)
Globulin, Total: 2.3 g/dL (ref 1.5–4.5)
Glucose: 92 mg/dL (ref 70–99)
Potassium: 4.9 mmol/L (ref 3.5–5.2)
Sodium: 139 mmol/L (ref 134–144)
Total Protein: 6.7 g/dL (ref 6.0–8.5)
eGFR: 71 mL/min/{1.73_m2} (ref >59)

## 2024-01-30 LAB — QUANTIFERON-TB GOLD PLUS
QuantiFERON Nil Value: 0.08 [IU]/mL
QuantiFERON TB1 Ag Value: 0.07 [IU]/mL
QuantiFERON TB2 Ag Value: 0.07 [IU]/mL

## 2024-01-30 LAB — HIV ANTIBODY (ROUTINE TESTING W REFLEX): HIV Screen 4th Generation wRfx: NONREACTIVE

## 2024-02-18 ENCOUNTER — Ambulatory Visit: Payer: Self-pay | Admitting: Family Medicine

## 2024-02-24 ENCOUNTER — Encounter: Payer: Self-pay | Admitting: Family Medicine

## 2024-03-06 ENCOUNTER — Telehealth: Payer: Self-pay | Admitting: Family Medicine

## 2024-03-06 NOTE — Telephone Encounter (Unsigned)
 Copied from CRM [id number]. Topic: Clinical - Lab/Test Results >> Mar 06, 2024  3:37 PM Caleb Campbell wrote: Reason for CRM: The patient would like to be contacted to discuss the status of their results from 01/27/24 and options for them to be electronically submitted in order for the patient to be eligible for treatment and housing   Please contact further if/when possible

## 2024-03-07 NOTE — Telephone Encounter (Signed)
 Lmtcb  When patient calls back please let him know negative result

## 2024-03-07 NOTE — Telephone Encounter (Signed)
 Needs to talk to nurse ASAP about TB results. Can send to email - [email or web address] and send to his MyChart.

## 2024-04-19 ENCOUNTER — Ambulatory Visit: Payer: Self-pay

## 2024-04-19 NOTE — Telephone Encounter (Signed)
 Apt scheduled.

## 2024-04-19 NOTE — Telephone Encounter (Signed)
 FYI Only or Action Required?: FYI only for provider.  Patient was last seen in primary care on 08/31/2023 by Severa Rock HERO, FNP.  Called Nurse Triage reporting Knee Pain.  Symptoms began ongoing issue, old injury for years ago .  Interventions attempted: OTC medications: Ibuprofen at times .  Symptoms are: gradually worsening.  Triage Disposition: See PCP Within 2 Weeks  Patient/caregiver understands and will follow disposition?: Yes  Pt states R knee has been getting worse at times, having more pain and increased swelling. At times will have to massage and pop back in place since had old MCL injury. Pt also reports he was incarcerated and missed last appt but when released had to walk from [place] to [place] barefoot and pt states had some blisters on feet for a while but been having issues with L foot swelling. Scheduled appt with PCP tomorrow 1150 DOD.   Copied from CRM [id number]. Topic: Clinical - Red Word Triage >> Apr 19, 2024  8:44 AM Vena H wrote: Red Word that prompted transfer to Nurse Triage: Pt is having right knee issues, previous has MCL surgery and is having swelling in his left foot. Reason for Disposition  Knee pain is a chronic symptom (recurrent or ongoing AND present > 4 weeks)  Answer Assessment - Initial Assessment Questions 1. LOCATION and RADIATION: Where is the pain located?      R knee front and back  2. QUALITY: What does the pain feel like?  (e.g., sharp, dull, aching, burning)     Aching  3. SEVERITY: How bad is the pain? What does it keep you from doing?   (Scale 1-10; or mild, moderate, severe)     Mild to moderate  4. ONSET: When did the pain start? Does it come and go, or is it there all the time?     Ongoing issues, on and off at times  6. SETTING: Has there been any recent work, exercise or other activity that involved that part of the body?      Had surgery d/t previous MCL injury years ago  8. ASSOCIATED SYMPTOMS: Is there any  swelling or redness of the knee?     Yes at times 9. OTHER SYMPTOMS: Do you have any other symptoms? (e.g., calf pain, chest pain, difficulty breathing, fever)     L foot swelling  Protocols used: Knee Pain-A-AH

## 2024-04-20 ENCOUNTER — Encounter: Payer: Self-pay | Admitting: Family Medicine

## 2024-04-20 ENCOUNTER — Ambulatory Visit: Payer: Self-pay | Admitting: Family Medicine

## 2024-04-20 ENCOUNTER — Ambulatory Visit (INDEPENDENT_AMBULATORY_CARE_PROVIDER_SITE_OTHER): Payer: Self-pay

## 2024-04-20 VITALS — BP 138/76 | HR 99 | Temp 98.2°F | Ht 73.0 in | Wt 162.0 lb

## 2024-04-20 DIAGNOSIS — M25561 Pain in right knee: Secondary | ICD-10-CM

## 2024-04-20 DIAGNOSIS — G8929 Other chronic pain: Secondary | ICD-10-CM

## 2024-04-20 DIAGNOSIS — M79672 Pain in left foot: Secondary | ICD-10-CM

## 2024-04-20 MED ORDER — PREDNISONE 20 MG PO TABS: 40.0000 mg | ORAL_TABLET | Freq: Every day | ORAL | 0 refills | Status: AC

## 2024-04-20 NOTE — Progress Notes (Signed)
 Subjective:  Patient ID: Caleb Campbell, male    DOB: [birth year], 43 y.o.   MRN: [id number]  Patient Care Team: Severa Rock HERO, FNP as PCP - General (Family Medicine)   Chief Complaint:  Foot Swelling (Left x 1 month after walking 10 miles in the rain ) and Knee Pain (Right knee pain that has been ongoing issue after old injury )   HPI: Caleb Campbell is a 43 y.o. male presenting on 04/20/2024 for Foot Swelling (Left x 1 month after walking 10 miles in the rain ) and Knee Pain (Right knee pain that has been ongoing issue after old injury )   Caleb Campbell is a 43 year old male who presents with left foot pain and right knee pain.  He has been experiencing left foot pain since May 3rd after walking a significant distance without shoes in the rain. The patient reports that his foot is painful and he has difficulty bending his toes. The pain is described as 'hurting fiercely at different times.' He has not had any prior imaging or treatment for this issue.  He also reports right knee pain, which he attributes to a previous injury sustained in high school while playing sports. He underwent a meniscus repair in 2008, but he believes it did not heal properly due to early weight-bearing. He describes episodes where the knee 'catches,' preventing full extension, and requires manipulation to relieve the catch. He has had an x-ray while incarcerated but no MRI. He mentions that inflammation sometimes requires massage for relief.  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 He was referred to an orthopedic specialist but faced scheduling issues and did not receive further treatment.  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          Relevant past medical, surgical, family, and social history reviewed and  updated as indicated.  Allergies and medications reviewed and updated. Data reviewed: Chart in Epic.   Past Medical History:  Diagnosis Date   IgA nephropathy     Past Surgical History:  Procedure Laterality Date   FINGER SURGERY     KNEE SURGERY      Social History   Socioeconomic History   Marital status: Divorced    Spouse name: Not on file   Number of children: 2   Years of education: Not on file   Highest education level: GED or equivalent  Occupational History   Not on file  Tobacco Use   Smoking status: Every Day    Current packs/day: 0.50    Types: Cigarettes   Smokeless tobacco: Former    Types: Engineer, drilling   Vaping status: Never Used  Substance and Sexual Activity   Alcohol use: Not Currently   Drug use: Not Currently   Sexual activity: Yes  Other Topics Concern   Not on file  Social History Narrative   Not on file   Social Drivers of Health   Financial Resource Strain: Not on file  Food Insecurity: Not on file  Transportation Needs: Not on file  Physical Activity: Not on file  Stress: Not on file  Social Connections: Not on file  Intimate Partner Violence: Not on file    Outpatient Encounter Medications as of 04/20/2024  Medication Sig  predniSONE  (DELTASONE ) 20 MG tablet Take 2 tablets (40 mg total) by mouth daily with breakfast for 5 days.   fluticasone  (FLONASE ) 50 MCG/ACT nasal spray Place 2 sprays into both nostrils daily. (Patient not taking: Reported on 04/20/2024)   levocetirizine (XYZAL ) 5 MG tablet Take 1 tablet (5 mg total) by mouth every evening. (Patient not taking: Reported on 04/20/2024)   losartan  (COZAAR ) 25 MG tablet Take 1 tablet (25 mg total) by mouth daily. (Patient not taking: Reported on 04/20/2024)   No facility-administered encounter medications on file as of 04/20/2024.    Allergies  Allergen Reactions   Nsaids Other (See Comments)    Pt has chronic kidney disease and NSAIDs are contraindicated.   Bupropion Other  (See Comments)    seizure    Pertinent ROS per HPI, otherwise unremarkable      Objective:  BP 138/76   Pulse 99   Temp 98.2 F (36.8 C)   Ht 6' 1 (1.854 m)   Wt 162 lb (73.5 kg)   SpO2 96%   BMI 21.37 kg/m    Wt Readings from Last 3 Encounters:  04/20/24 162 lb (73.5 kg)  08/31/23 155 lb (70.3 kg)  11/20/22 159 lb 12.8 oz (72.5 kg)    Physical Exam Vitals and nursing note reviewed.  Constitutional:      General: He is not in acute distress.    Appearance: He is not ill-appearing, toxic-appearing or diaphoretic.  HENT:     Head: Normocephalic and atraumatic.     Mouth/Throat:     Mouth: Mucous membranes are moist.  Eyes:     Conjunctiva/sclera: Conjunctivae normal.  Cardiovascular:     Rate and Rhythm: Normal rate.     Pulses: Normal pulses.          Dorsalis pedis pulses are 2+ on the left side.       Posterior tibial pulses are 2+ on the left side.     Heart sounds: Normal heart sounds.  Pulmonary:     Effort: Pulmonary effort is normal.     Breath sounds: Normal breath sounds.  Musculoskeletal:     Right upper leg: Normal.     Right knee: No swelling or deformity. Decreased range of motion.     Right lower leg: Normal. No edema.     Left lower leg: No edema.     Left foot: Decreased range of motion (flexion of toes). No deformity, bunion, Charcot foot, foot drop or prominent metatarsal heads.  Feet:     Left foot:     Skin integrity: Callus present.     Toenail Condition: Left toenails are normal.  Skin:    General: Skin is warm and dry.     Capillary Refill: Capillary refill takes less than 2 seconds.  Neurological:     General: No focal deficit present.     Mental Status: He is alert and oriented to person, place, and time.  Psychiatric:        Mood and Affect: Mood normal. Affect is inappropriate.        Speech: Speech is rapid and pressured.        Behavior: Behavior is hyperactive. Behavior is cooperative.        Thought Content: Thought  content normal.        Judgment: Judgment normal.     X-Ray: left foot: Arthritic changes in foot joints with limited flexion. Sesamoid bones present in foot.  No acute findings. Preliminary x-ray reading by  Rosaline Bruns, FNP-C, [facility].        Results for orders placed or performed in visit on 01/27/24  HIV Antibody (routine testing w rflx)   Collection Time: 01/27/24  3:50 PM  Result Value Ref Range   HIV Screen 4th Generation wRfx Non Reactive Non Reactive  CMP14+EGFR   Collection Time: 01/27/24  3:50 PM  Result Value Ref Range   Glucose 92 70 - 99 mg/dL   BUN 25 (H) 6 - 24 mg/dL   Creatinine, Ser 8.72 0.76 - 1.27 mg/dL   eGFR 71 >40 fO/fpw/8.26   BUN/Creatinine Ratio 20 9 - 20   Sodium 139 134 - 144 mmol/L   Potassium 4.9 3.5 - 5.2 mmol/L   Chloride 104 96 - 106 mmol/L   CO2 23 20 - 29 mmol/L   Calcium 9.3 8.7 - 10.2 mg/dL   Total Protein 6.7 6.0 - 8.5 g/dL   Albumin 4.4 4.1 - 5.1 g/dL   Globulin, Total 2.3 1.5 - 4.5 g/dL   Bilirubin Total 0.3 0.0 - 1.2 mg/dL   Alkaline Phosphatase 110 44 - 121 IU/L   AST 24 0 - 40 IU/L   ALT 17 0 - 44 IU/L  CBC with Differential/Platelet   Collection Time: 01/27/24  3:50 PM  Result Value Ref Range   WBC 7.0 3.4 - 10.8 x10E3/uL   RBC 4.59 4.14 - 5.80 x10E6/uL   Hemoglobin 14.6 13.0 - 17.7 g/dL   Hematocrit 55.6 62.4 - 51.0 %   MCV 97 79 - 97 fL   MCH 31.8 26.6 - 33.0 pg   MCHC 33.0 31.5 - 35.7 g/dL   RDW 88.0 88.3 - 84.5 %   Platelets 311 150 - 450 x10E3/uL   Neutrophils 43 Not Estab. %   Lymphs 41 Not Estab. %   Monocytes 11 Not Estab. %   Eos 4 Not Estab. %   Basos 1 Not Estab. %   Neutrophils Absolute 3.0 1.4 - 7.0 x10E3/uL   Lymphocytes Absolute 2.9 0.7 - 3.1 x10E3/uL   Monocytes Absolute 0.7 0.1 - 0.9 x10E3/uL   EOS (ABSOLUTE) 0.3 0.0 - 0.4 x10E3/uL   Basophils Absolute 0.1 0.0 - 0.2 x10E3/uL   Immature Granulocytes 0 Not Estab. %   Immature Grans (Abs) 0.0 0.0 - 0.1 x10E3/uL  QuantiFERON-TB Gold Plus   Collection  Time: 01/27/24  3:50 PM  Result Value Ref Range   QuantiFERON Incubation Incubation performed.    QuantiFERON Criteria Comment    QuantiFERON TB1 Ag Value 0.07 IU/mL   QuantiFERON TB2 Ag Value 0.07 IU/mL   QuantiFERON Nil Value 0.08 IU/mL   QuantiFERON Mitogen Value >10.00 IU/mL   QuantiFERON-TB Gold Plus Negative Negative       Pertinent labs & imaging results that were available during my care of the patient were reviewed by me and considered in my medical decision making.  Assessment & Plan:  Jaremy was seen today for foot swelling and knee pain.  Diagnoses and all orders for this visit:  Left foot pain -     DG Foot Complete Left -     Ambulatory referral to Orthopedic Surgery -     predniSONE  (DELTASONE ) 20 MG tablet; Take 2 tablets (40 mg total) by mouth daily with breakfast for 5 days.  Chronic pain of right knee -     Ambulatory referral to Orthopedic Surgery -     predniSONE  (DELTASONE ) 20 MG tablet; Take 2 tablets (40 mg total) by mouth daily with breakfast  for 5 days.     Left foot arthritis Chronic pain and swelling in the left foot since May, exacerbated by ambulation without footwear. X-ray reveals arthritic changes and sesamoid bones contributing to symptoms. No acute injury identified. Significant discomfort noted. Staying active and using supportive insoles are recommended. Steroids are considered as an alternative to NSAIDs for inflammation management. - Get supportive insoles for shoes - Prescribe a 5-day course of oral steroids for inflammation - Refer to orthopedics for further evaluation  Right knee pain Chronic right knee pain with history of meniscus repair in 2008. Reports occasional locking and inability to fully extend the knee, requiring manual manipulation for relief. Previous x-rays done in prison, but no MRI due to insurance issues. Pain likely due to unresolved meniscal issues or arthritis. MRI is needed for further evaluation, but insurance  coverage is a concern. - Refer to orthopedics for further evaluation and possible MRI  Right wrist pain Right wrist injury approximately one year ago with small fractures identified. Did not follow up with orthopedics due to scheduling issues. Referral to orthopedics placed.   Follow-up Requires follow-up with orthopedics for both foot and knee issues. Medication for foot pain to be picked up from pharmacy. - Follow up with orthopedics after referral - Pick up prescribed medication from [facility]          Continue all other maintenance medications.  Follow up plan: Return if symptoms worsen or fail to improve.   Continue healthy lifestyle choices, including diet (rich in fruits, vegetables, and lean proteins, and low in salt and simple carbohydrates) and exercise (at least 30 minutes of moderate physical activity daily).  Educational handout given for foot pain, chronic knee pain  The above assessment and management plan was discussed with the patient. The patient verbalized understanding of and has agreed to the management plan. Patient is aware to call the clinic if they develop any new symptoms or if symptoms persist or worsen. Patient is aware when to return to the clinic for a follow-up visit. Patient educated on when it is appropriate to go to the emergency department.   Rosaline Bruns, FNP-C [facility] [phone]

## 2024-04-28 ENCOUNTER — Ambulatory Visit: Payer: Self-pay | Admitting: Family Medicine

## 2024-06-25 ENCOUNTER — Encounter (HOSPITAL_COMMUNITY): Payer: Self-pay | Admitting: Emergency Medicine

## 2024-06-25 ENCOUNTER — Emergency Department (HOSPITAL_COMMUNITY)
Admission: EM | Admit: 2024-06-25 | Discharge: 2024-06-25 | Disposition: A | Discharge status: Home / Self Care | Payer: Self-pay | Attending: Emergency Medicine | Admitting: Emergency Medicine

## 2024-06-25 ENCOUNTER — Emergency Department (HOSPITAL_COMMUNITY): Payer: Self-pay

## 2024-06-25 ENCOUNTER — Other Ambulatory Visit: Payer: Self-pay

## 2024-06-25 DIAGNOSIS — L089 Local infection of the skin and subcutaneous tissue, unspecified: Secondary | ICD-10-CM | POA: Insufficient documentation

## 2024-06-25 DIAGNOSIS — S40861A Insect bite (nonvenomous) of right upper arm, initial encounter: Secondary | ICD-10-CM | POA: Insufficient documentation

## 2024-06-25 DIAGNOSIS — S8001XA Contusion of right knee, initial encounter: Secondary | ICD-10-CM | POA: Insufficient documentation

## 2024-06-25 DIAGNOSIS — S80861A Insect bite (nonvenomous), right lower leg, initial encounter: Secondary | ICD-10-CM | POA: Insufficient documentation

## 2024-06-25 DIAGNOSIS — S80862A Insect bite (nonvenomous), left lower leg, initial encounter: Secondary | ICD-10-CM | POA: Insufficient documentation

## 2024-06-25 DIAGNOSIS — S40862A Insect bite (nonvenomous) of left upper arm, initial encounter: Secondary | ICD-10-CM | POA: Insufficient documentation

## 2024-06-25 DIAGNOSIS — W57XXXA Bitten or stung by nonvenomous insect and other nonvenomous arthropods, initial encounter: Secondary | ICD-10-CM

## 2024-06-25 DIAGNOSIS — W19XXXA Unspecified fall, initial encounter: Secondary | ICD-10-CM | POA: Insufficient documentation

## 2024-06-25 MED ORDER — KETOROLAC TROMETHAMINE 15 MG/ML IJ SOLN
15.0000 mg | Freq: Once | INTRAMUSCULAR | Status: AC
  Administered 2024-06-25: 15 mg via INTRAMUSCULAR
  Filled 2024-06-25: qty 1

## 2024-06-25 MED ORDER — DOXYCYCLINE HYCLATE 100 MG PO TABS
100.0000 mg | ORAL_TABLET | Freq: Once | ORAL | Status: AC
  Administered 2024-06-25: 100 mg via ORAL
  Filled 2024-06-25: qty 1

## 2024-06-25 MED ORDER — DOXYCYCLINE HYCLATE 100 MG PO CAPS: 100.0000 mg | ORAL_CAPSULE | Freq: Two times a day (BID) | ORAL | 0 refills | Status: AC

## 2024-06-25 NOTE — ED Provider Notes (Signed)
 [place] EMERGENCY DEPARTMENT AT [facility]  Provider Note  CSN: [id number] Arrival date & time: 06/25/24 0142  History Chief Complaint  Patient presents with   Caleb Campbell is a 43 y.o. male here for evaluation after a fall 2 days ago. He reports he fell through floor joists while moving furniture in a house with a rotted floor and injured his R knee. He has had pain and swelling since then as well as several other nonspecific complaints such as feeling 'off', waking up with a sense of falling, and multiple ?insect bites on arms and legs. No fever.    Home Medications Prior to Admission medications   Medication Sig Start Date End Date Taking? Authorizing Provider  doxycycline (VIBRAMYCIN) 100 MG capsule Take 1 capsule (100 mg total) by mouth 2 (two) times daily. 06/25/24  Yes Roselyn Carlin NOVAK, MD     Allergies    Nsaids and Bupropion   Review of Systems   Review of Systems Please see HPI for pertinent positives and negatives  Physical Exam BP 134/83 (BP Location: Right Arm)   Pulse 88   Temp 98.8 F (37.1 C) (Oral)   Resp 19   Ht 6' 1 (1.854 m)   Wt 73.5 kg   SpO2 99%   BMI 21.38 kg/m   Physical Exam Vitals and nursing note reviewed.  Constitutional:      Appearance: Normal appearance.  HENT:     Head: Normocephalic and atraumatic.     Nose: Nose normal.     Mouth/Throat:     Mouth: Mucous membranes are moist.  Eyes:     Extraocular Movements: Extraocular movements intact.     Conjunctiva/sclera: Conjunctivae normal.  Cardiovascular:     Rate and Rhythm: Normal rate.  Pulmonary:     Effort: Pulmonary effort is normal.     Breath sounds: Normal breath sounds.  Abdominal:     General: Abdomen is flat.     Palpations: Abdomen is soft.     Tenderness: There is no abdominal tenderness.  Musculoskeletal:        General: Swelling (R knee) and tenderness present. No deformity. Normal range of motion.     Cervical back: Neck  supple.  Skin:    General: Skin is warm and dry.     Comments: Multiple areas of erythema, induration and swelling to arms and legs without focal fluctuance  Neurological:     General: No focal deficit present.     Mental Status: He is alert.  Psychiatric:        Mood and Affect: Mood normal.     ED Results / Procedures / Treatments   EKG None  Procedures Procedures  Medications Ordered in the ED Medications  ketorolac (TORADOL) 15 MG/ML injection 15 mg (has no administration in time range)  doxycycline (VIBRA-TABS) tablet 100 mg (has no administration in time range)    Initial Impression and Plan  Patient here for R knee pain after recent fall, also has vague feeling of being 'off' but other than signs of insect bite with possible early cellulitis he has an unremarkable exam and vitals. Offered xray but he declines, low likelihood of fracture. He has had prior meniscus surgery on that side and prefers to follow up with Ortho. Will give a dose of toradol here for pain and Rx for doxycycline for skin lesions.    ED Course       MDM Rules/Calculators/A&P Medical Decision  Making Problems Addressed: Bug bite with infection, initial encounter: acute illness or injury Contusion of right knee, initial encounter: acute illness or injury Fall, initial encounter: acute illness or injury  Risk Prescription drug management.     Final Clinical Impression(s) / ED Diagnoses Final diagnoses:  Fall, initial encounter  Contusion of right knee, initial encounter  Bug bite with infection, initial encounter    Rx / DC Orders ED Discharge Orders          Ordered    doxycycline (VIBRAMYCIN) 100 MG capsule  2 times daily        06/25/24 0214             Roselyn Carlin NOVAK, MD 06/25/24 [phone]

## 2024-06-25 NOTE — ED Notes (Signed)
 Pt requesting to leave. Xray to be canceled. Pt ambulatory.

## 2024-06-25 NOTE — ED Notes (Signed)
 Pt requesting to cancel xray. Pt agreeable to take medicine and will follow up with ortho on Monday. Dr Roselyn made aware.

## 2024-06-25 NOTE — ED Notes (Signed)
 ED Provider at bedside.

## 2024-06-25 NOTE — ED Triage Notes (Signed)
 Pt arrived to ED c/o fall 2 days ago. Pt states he just doesn't feel right. Pt states he has possible bite marks to his arms and legs. Pt also c/o bilateral leg swelling.

## 2024-08-09 ENCOUNTER — Encounter: Payer: Self-pay | Admitting: *Deleted

## 2024-09-01 ENCOUNTER — Encounter: Payer: Self-pay | Admitting: Family Medicine

## 2024-09-05 ENCOUNTER — Ambulatory Visit: Payer: Self-pay | Admitting: Family Medicine
